# Patient Record
Sex: Female | Born: 1955 | Race: White | Hispanic: No | Marital: Married | Smoking: Never smoker
Health system: Southern US, Community
[De-identification: ages and names within clinical notes are randomized; demographics above are authoritative.]

## PROBLEM LIST (undated history)

## (undated) DIAGNOSIS — E785 Hyperlipidemia, unspecified: Secondary | ICD-10-CM

## (undated) DIAGNOSIS — Z9851 Tubal ligation status: Secondary | ICD-10-CM

## (undated) DIAGNOSIS — M1990 Unspecified osteoarthritis, unspecified site: Secondary | ICD-10-CM

## (undated) DIAGNOSIS — R011 Cardiac murmur, unspecified: Secondary | ICD-10-CM

## (undated) HISTORY — DX: Tubal ligation status: Z98.51

## (undated) HISTORY — DX: Cardiac murmur, unspecified: R01.1

## (undated) HISTORY — DX: Unspecified osteoarthritis, unspecified site: M19.90

## (undated) HISTORY — DX: Hyperlipidemia, unspecified: E78.5

## (undated) HISTORY — PX: KNEE SURGERY: SHX244

---
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
## 1980-02-26 HISTORY — PX: TUBAL LIGATION: SHX77

## 1998-11-28 ENCOUNTER — Other Ambulatory Visit: Admission: RE | Admit: 1998-11-28 | Discharge: 1998-11-28 | Payer: Self-pay | Admitting: Obstetrics and Gynecology

## 2002-01-19 ENCOUNTER — Other Ambulatory Visit: Admission: RE | Admit: 2002-01-19 | Discharge: 2002-01-19 | Payer: Self-pay | Admitting: Gynecology

## 2002-02-11 ENCOUNTER — Encounter: Admission: RE | Admit: 2002-02-11 | Discharge: 2002-05-12 | Payer: Self-pay | Admitting: Obstetrics and Gynecology

## 2005-03-07 ENCOUNTER — Other Ambulatory Visit: Admission: RE | Admit: 2005-03-07 | Discharge: 2005-03-07 | Payer: Self-pay | Admitting: Gynecology

## 2006-06-26 ENCOUNTER — Other Ambulatory Visit: Admission: RE | Admit: 2006-06-26 | Discharge: 2006-06-26 | Payer: Self-pay | Admitting: Gynecology

## 2007-08-27 ENCOUNTER — Other Ambulatory Visit: Admission: RE | Admit: 2007-08-27 | Discharge: 2007-08-27 | Payer: Self-pay | Admitting: Gynecology

## 2009-04-20 ENCOUNTER — Ambulatory Visit: Payer: Self-pay | Admitting: Women's Health

## 2009-04-20 ENCOUNTER — Other Ambulatory Visit: Admission: RE | Admit: 2009-04-20 | Discharge: 2009-04-20 | Payer: Self-pay | Admitting: Gynecology

## 2009-11-29 ENCOUNTER — Encounter: Admission: RE | Admit: 2009-11-29 | Discharge: 2009-11-29 | Payer: Self-pay | Admitting: Gynecology

## 2010-07-09 ENCOUNTER — Other Ambulatory Visit (HOSPITAL_COMMUNITY)
Admission: RE | Admit: 2010-07-09 | Discharge: 2010-07-09 | Disposition: A | Discharge status: Home / Self Care | Payer: Managed Care, Other (non HMO) | Source: Ambulatory Visit | Attending: Gynecology | Admitting: Gynecology

## 2010-07-09 ENCOUNTER — Encounter (INDEPENDENT_AMBULATORY_CARE_PROVIDER_SITE_OTHER): Payer: Managed Care, Other (non HMO) | Admitting: Women's Health

## 2010-07-09 ENCOUNTER — Other Ambulatory Visit: Payer: Self-pay | Admitting: Women's Health

## 2010-07-09 DIAGNOSIS — Z13220 Encounter for screening for lipoid disorders: Secondary | ICD-10-CM

## 2010-07-09 DIAGNOSIS — Z124 Encounter for screening for malignant neoplasm of cervix: Secondary | ICD-10-CM | POA: Insufficient documentation

## 2010-07-09 DIAGNOSIS — Z833 Family history of diabetes mellitus: Secondary | ICD-10-CM

## 2010-07-09 DIAGNOSIS — E079 Disorder of thyroid, unspecified: Secondary | ICD-10-CM

## 2010-07-09 DIAGNOSIS — Z01419 Encounter for gynecological examination (general) (routine) without abnormal findings: Secondary | ICD-10-CM

## 2010-07-19 ENCOUNTER — Encounter (INDEPENDENT_AMBULATORY_CARE_PROVIDER_SITE_OTHER): Payer: Managed Care, Other (non HMO)

## 2010-07-19 DIAGNOSIS — Z13820 Encounter for screening for osteoporosis: Secondary | ICD-10-CM

## 2010-10-19 ENCOUNTER — Ambulatory Visit (INDEPENDENT_AMBULATORY_CARE_PROVIDER_SITE_OTHER): Payer: Managed Care, Other (non HMO) | Admitting: Women's Health

## 2010-10-19 ENCOUNTER — Encounter: Payer: Self-pay | Admitting: Women's Health

## 2010-10-19 ENCOUNTER — Other Ambulatory Visit: Payer: Managed Care, Other (non HMO)

## 2010-10-19 VITALS — BP 130/70

## 2010-10-19 DIAGNOSIS — N950 Postmenopausal bleeding: Secondary | ICD-10-CM

## 2010-10-19 MED ORDER — MEGESTROL ACETATE 20 MG PO TABS: 20.0000 mg | ORAL_TABLET | Freq: Two times a day (BID) | ORAL | Status: AC

## 2010-10-19 NOTE — Progress Notes (Signed)
  Presents with c/o postmenopausal bleeding. LMP June 1 of 2011, had bleeding that started August 8  and has continued, heavy at times with gushing bright red bleeding. States she's used about 4 pads today. Ultrasound today does show a single fibroid or endometrial polyp 20 x 18 x 20mm, both right and ovaries do appear normal. Will schedule a sonohysterogram, bx with Dr. Lily Peer. Did review this will probably need to be removed hysteroscopically.  Handout was given.  Will treat with Megace 20 mg by mouth twice a day until bleeding stops, will schedule followup with Dr. Lily Peer, and will call if no relief.

## 2010-11-13 ENCOUNTER — Ambulatory Visit: Admission: RE | Admit: 2010-11-13 | Payer: Managed Care, Other (non HMO) | Source: Ambulatory Visit

## 2010-11-13 ENCOUNTER — Ambulatory Visit (INDEPENDENT_AMBULATORY_CARE_PROVIDER_SITE_OTHER): Payer: Managed Care, Other (non HMO) | Admitting: Gynecology

## 2010-11-13 DIAGNOSIS — D250 Submucous leiomyoma of uterus: Secondary | ICD-10-CM

## 2010-11-13 DIAGNOSIS — N949 Unspecified condition associated with female genital organs and menstrual cycle: Secondary | ICD-10-CM

## 2010-11-13 DIAGNOSIS — D259 Leiomyoma of uterus, unspecified: Secondary | ICD-10-CM

## 2010-11-13 DIAGNOSIS — N950 Postmenopausal bleeding: Secondary | ICD-10-CM

## 2010-11-13 NOTE — Progress Notes (Signed)
Patient is a 55 year old gravida 4 para 2 AB 2 who was seen in the office on August 24 by my nurse practitioner Maryelizabeth Rowan. Patient had been complaining of postmenopausal bleeding recently which started in August 8 and had continued. Patient reported to be heavy at times gushing of bright red blood. The bleeding require use 4 pads per day. She had an ultrasound which demonstrated single fibroid on an endometrial polyp or myoma and for this reason she was here in the office for a sonohysterogram and possible endometrial biopsy both ovaries appeared to be normal otherwise.  Sonohysterogram (saline infusion histogram) demonstrated a right posterior wall submucous myoma measuring 24 x 23 x 22 mm. Uterus measured 8.4 x 5.7 x 4.3 mm and her endometrial stripe was 4.0 mm ovaries otherwise normal.  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 Patient will be scheduled for resectoscopic myomectomy using the hysteroscopic morcellator. The risks benefits and pros and cons of the operation were discussed with the patient and literature information was provided. All questions are answered and we'll follow accordingly and schedule.

## 2010-11-14 ENCOUNTER — Other Ambulatory Visit: Payer: Self-pay | Admitting: Gynecology

## 2010-11-14 DIAGNOSIS — Z01818 Encounter for other preprocedural examination: Secondary | ICD-10-CM

## 2010-11-23 ENCOUNTER — Encounter: Payer: Self-pay | Admitting: Women's Health

## 2010-12-03 ENCOUNTER — Ambulatory Visit: Payer: Managed Care, Other (non HMO) | Admitting: Gynecology

## 2010-12-03 DIAGNOSIS — Z01818 Encounter for other preprocedural examination: Secondary | ICD-10-CM

## 2010-12-03 DIAGNOSIS — R82998 Other abnormal findings in urine: Secondary | ICD-10-CM

## 2010-12-05 ENCOUNTER — Other Ambulatory Visit: Payer: Self-pay | Admitting: Gynecology

## 2010-12-05 ENCOUNTER — Ambulatory Visit (HOSPITAL_BASED_OUTPATIENT_CLINIC_OR_DEPARTMENT_OTHER)
Admission: RE | Admit: 2010-12-05 | Discharge: 2010-12-05 | Disposition: A | Discharge status: Home / Self Care | Payer: Managed Care, Other (non HMO) | Source: Ambulatory Visit | Attending: Gynecology | Admitting: Gynecology

## 2010-12-05 DIAGNOSIS — Z79899 Other long term (current) drug therapy: Secondary | ICD-10-CM | POA: Insufficient documentation

## 2010-12-05 DIAGNOSIS — N950 Postmenopausal bleeding: Secondary | ICD-10-CM | POA: Insufficient documentation

## 2010-12-05 DIAGNOSIS — D250 Submucous leiomyoma of uterus: Secondary | ICD-10-CM

## 2010-12-05 DIAGNOSIS — Z800 Family history of malignant neoplasm of digestive organs: Secondary | ICD-10-CM | POA: Insufficient documentation

## 2010-12-05 DIAGNOSIS — Z8041 Family history of malignant neoplasm of ovary: Secondary | ICD-10-CM | POA: Insufficient documentation

## 2010-12-05 DIAGNOSIS — Z833 Family history of diabetes mellitus: Secondary | ICD-10-CM | POA: Insufficient documentation

## 2010-12-05 HISTORY — PX: HYSTEROSCOPY: SHX211

## 2010-12-10 ENCOUNTER — Encounter: Payer: Self-pay | Admitting: Gynecology

## 2010-12-12 ENCOUNTER — Telehealth: Payer: Self-pay | Admitting: *Deleted

## 2010-12-12 NOTE — Telephone Encounter (Signed)
Not unusual to bleed a week out. If continues I would recommend office visit Friday before weekend to check. If it's down to spotting or goes away then I would watch and follow up at her postop appointment

## 2010-12-12 NOTE — Telephone Encounter (Signed)
Pt informed and will watch it. And call Fri if needed. Kw

## 2010-12-12 NOTE — Telephone Encounter (Signed)
Pt is one week out from Resectoscopic Myomectomy with myosure with Dr Lily Peer. She began spotting pink 2 days but today is having a red flow. Is this normal to bleed one week after this procedure? If not pt has already been advised may have to have OV. She wanted me to check first if some bleeding could be normal. Pls Advise

## 2010-12-19 ENCOUNTER — Ambulatory Visit (INDEPENDENT_AMBULATORY_CARE_PROVIDER_SITE_OTHER): Payer: Managed Care, Other (non HMO) | Admitting: Gynecology

## 2010-12-19 ENCOUNTER — Encounter: Payer: Self-pay | Admitting: Gynecology

## 2010-12-19 VITALS — BP 120/88

## 2010-12-19 DIAGNOSIS — L292 Pruritus vulvae: Secondary | ICD-10-CM

## 2010-12-19 DIAGNOSIS — N390 Urinary tract infection, site not specified: Secondary | ICD-10-CM

## 2010-12-19 DIAGNOSIS — L293 Anogenital pruritus, unspecified: Secondary | ICD-10-CM

## 2010-12-19 DIAGNOSIS — R300 Dysuria: Secondary | ICD-10-CM

## 2010-12-19 DIAGNOSIS — D259 Leiomyoma of uterus, unspecified: Secondary | ICD-10-CM

## 2010-12-19 MED ORDER — FLUCONAZOLE 150 MG PO TABS: 150.0000 mg | ORAL_TABLET | Freq: Once | ORAL | Status: AC

## 2010-12-19 MED ORDER — CLINDAMYCIN PHOSPHATE 2 % VA CREA: 1.0000 | TOPICAL_CREAM | Freq: Every day | VAGINAL | Status: AC

## 2010-12-19 MED ORDER — NITROFURANTOIN MONOHYD MACRO 100 MG PO CAPS: 100.0000 mg | ORAL_CAPSULE | Freq: Two times a day (BID) | ORAL | Status: AC

## 2010-12-19 NOTE — H&P (Signed)
NAME:  Julia Warren, Julia Warren             ACCOUNT NO.:  [id number]  MEDICAL RECORD NO.:  [id number]  LOCATION:                                 FACILITY:  PHYSICIAN:  Elnathan Fulford H. Lily Peer, M.D.DATE OF BIRTH:  09/11/1955  DATE OF ADMISSION:  12/05/2010 DATE OF DISCHARGE:                             HISTORY & PHYSICAL   The patient is scheduled for surgery tomorrow, October 10th, at 1 p.m. at [facility].  Please have history and physical available.  CHIEF COMPLAINT: 1. Postmenopausal bleeding. 2. Submucous myoma.  HISTORY:  The patient is a 55 year old, gravida 4, para 2, AB 2, who had been complaining of postmenopausal bleeding, which started on August 8th and has continued for several days.  She described as heavy at times and gushing of bright red blood.  She had bled to the point that had required 4 pads per day.  She had an ultrasound, which demonstrated a single submucous myoma, which measured 24 x 23 x 22 mm.  The ovaries otherwise were normal.  Her endometrial stripe was 4.0 mm.  PAST MEDICAL HISTORY:  She is allergic to: 1. PENICILLIN. 2. CODEINE.  PAST OBSTETRICAL HISTORY: 1. She has had bilateral tubal ligation in the past. 2. History of HSV in 1994.  MEDICATIONS:  Consist of: 1. Valtrex p.r.n. 2. She takes fish oil daily. 3. Flaxseed daily. 4. Vitamin B and C daily.  SURGERIES:  Included: 1. Appendectomy. 2. Bilateral tubal sterilization procedures. 3. Her children were delivered vaginally.  FAMILY HISTORY:  Significant for diabetes in her mother, colon cancer in maternal grandmother, and ovarian or uterine cancer in maternal aunt.  REVIEW OF SYSTEMS:  Unremarkable.  PHYSICAL EXAMINATION:  VITAL SIGNS:  The patient weighs 171 pounds, she is 5 feet 4-1/4 inch tall, BMI 29, blood pressure 110/70. HEENT:  Unremarkable. NECK:  Supple.  Trachea midline.  No carotid bruits.  No thyromegaly. LUNGS:  Clear to auscultation.  No rhonchi or  wheezes. HEART:  Regular rate and rhythm.  No murmurs or gallops. BREAST EXAM:  Not done. ABDOMEN:  Soft, nontender.  No rebound or guarding. PELVIC:  Bartholin, urethra, and Skene glands within normal limits. Vagina and cervix, no gross lesions on inspection.  Uterus is in upper limits of normal.  No palpable adnexal masses, although limited due to pendulous abdomen. RECTAL EXAM:  Deferred.  ASSESSMENT:  The patient is a 55 year old with postmenopausal bleeding. Sonohysterogram demonstrated submucous myoma measuring 22 x 10 x 20 mm. The patient is scheduled to undergo resectoscopic myomectomy at [facility] on Wednesday, October 10th at 1 p.m.  The risks, benefits, and pros and cons of the operation were discussed to include infection, although she will receive prophylaxis antibiotic.  The risk for deep venous thrombosis and subsequent pulmonary embolism, she will have PSI stockings.  In the event of uncontrollable hemorrhage or uterine perforation or fluid extravasation or is an emergency, a laparotomy may need to be performed and the patient may need to have a total hysterectomy with or without removal of both tubes and ovary.  In the event that she would need blood or blood products, she is fully aware of the potential risk  of anaphylactic reaction, hepatitis, and AIDS.  All these issues were discussed with the patient.  All questions were answered.  We will follow accordingly.  PLAN:  The patient is scheduled for resectoscopic myomectomy on Wednesday, October 10th at 13 a.m. at [facility]. Please have history and physical available.     Zulay Corrie H. Lily Peer, M.D.     JHF/MEDQ  D:  12/04/2010  T:  12/05/2010  Job:  [id number]  Electronically Signed by Reynaldo Minium M.D. on 12/19/2010 02:29:20 PM

## 2010-12-19 NOTE — Op Note (Signed)
NAME:  JOYCIE, AERTS             ACCOUNT NO.:  [id number]  MEDICAL RECORD NO.:  [id number]  LOCATION:                               FACILITY:  [facility]  PHYSICIAN:  Juan H. Lily Peer, M.D.DATE OF BIRTH:  10/06/1955  DATE OF PROCEDURE:  12/05/2010 DATE OF DISCHARGE:                              OPERATIVE REPORT   INDICATION FOR OPERATION:  A 55 year old gravida 4, para 2, OB 2 with postmenopausal bleeding.  Preoperative sonohysterogram demonstrated a submucous myoma, measuring 24 x 23 x 22 mm with an endometrial stripe of 4.0 mm.  PREOPERATIVE DIAGNOSES: 1. Postmenopausal bleeding. 2. Submucous myoma.  POSTOPERATIVE DIAGNOSIS: 1. Postmenopausal bleeding. 2. Submucous myoma.  ANESTHESIA:  General endotracheal anesthesia.  PROCEDURE PERFORMED:  Resectoscopic myomectomy.  FINDINGS:  A 2.5 cm submucous myoma, broad based, posteriorly located in the uterus.  Both tubal ostia well identified, thin endometrium otherwise.  Myoma highly vascular.  DESCRIPTION OF OPERATION:  After the patient was adequately counseled, she was taken to the operative room where she underwent a successful general endotracheal anesthesia.  Due to her allergy with penicillin, she had received Cleocin and gentamicin (prophylaxis).  She also had PSA stockings for DVT prophylaxis as well.  After general endotracheal anesthesia was obtained, she was placed in high lithotomy position.  The vagina and perineum were prepped and draped in the sterile fashion.  The patient previously voided before coming to the operating room, so an in- and-out catheterization was not done.  Bimanual examination demonstrated an anteverted uterus with no palpable adnexal masses.  A weighted speculum was placed in the posterior vaginal vault and  a Sims retractor in the anterior vaginal vault.  A single-toothed tenaculum was grasped on the anterior cervical lip.  For hemostatic control, the patient was given Pitressin as a  paracervical block consisting of 20 units per 20 cc of normal saline for a total of 10 cc.  The cervix hence was dilated with serial dilators to a size of 23 mm.  Once this was accomplished, the MyoSure operative resectoscope was introduced into the intrauterine cavity.  Normal saline was the distending media, pictures were obtained. The myoma encroached most of the endometrial cavity.  The MyoSure morcellator was utilized to systematically shaved from 3 o'clock to 9 o'clock to try to morcellate the myoma, but due to the consistency in being hard and calcified, we cannot finish the operation with the resectoscopic morcellator.  The operating element was then switched to a 90 degree wire loop, attached to the Valleylab electrical surgical generator.  The distending media then was switched from normal saline to 1.5% glycine.  The lines were flushed before commencing with the operation.  The operative resectoscope was introduced into the intrauterine cavity and the Valleylab electrical surgical generator set at 80 watts on the cutting mode and 70 watts on the coagulation mode. The myoma was resected to pretty close to its base, but due to the fact that on sonohysterogram demonstrated this myoma extended into the myometrium,  It was decided not to pursue any further and running the risk of uterine perforation, as had been previously discussed with the husband and the patient preoperatively.  The operating element was then  switched to the VaporTrode whereby the Valleylab electrosurgical generator was set at 160 watts on the cutting mode and 70 watts on the coagulation mode to ablate some more of the myoma and contain some of the vascularity and hopefully that the myoma start to necrosis on its own postoperatively.  Pre and post resectoscopic myomectomy procedures were obtained.  The patient tolerated the procedure well.  The single- toothed tenaculum was removed.  The patient was awakened,  transferred to recovery room with stable vital signs.  Blood loss was minimal.  Fluid resuscitation consisted of 1200 cc of lactated Ringer.  Fluid deficit was 250 cc.  The patient tolerated the procedure well and was transferred to recovery room with stable vital signs.     Juan H. Lily Peer, M.D.     JHF/MEDQ  D:  12/05/2010  T:  12/06/2010  Job:  [id number]  Electronically Signed by Reynaldo Minium M.D. on 12/19/2010 02:29:37 PM

## 2010-12-19 NOTE — Progress Notes (Signed)
Patient is a 55 year old gravida 4 para 2 Ab2 with postmenopausal bleeding had undergone a preoperative evaluation consisting of sonohysterogram which demonstrated a submucous myoma which measured 24 x 23 x 2 mm with a uterine endometrial stripe of 4.0 mm. She was taken to the operating room October 10 whereby she underwent a resectoscopic myomectomy using the Myosure morcellator as well as the operative resectoscope. The myoma was partially embedded in the myometrium so a complete enucleation was not possible without perforating the uterus. The base of the myoma had been ablated as well. Patient presented to the office today for a postop visit 2 weeks later she is having no bleeding now she did for a few days afterwards. Her pathology report demonstrated a benign fibroids.  Pelvic exam: Bartholin urethra Skene glands within normal limits Vagina: No lesions or discharge Cervix: No lesions or discharge Uterus: Anteverted normal size shape and consistency Adnexa: No palpable masses or tenderness Rectal exam: Not done  Assessment 2 weeks postop status post resectoscopic myomectomy. Complete enucleation was not possible due to the fact the myoma was partially transmural. Pictures were shown to the patient will monitor over the course of the next 6 months when she returned back for sonohysterogram. If she recurs with the bleeding we discussed then proceeding with a total laparoscopic hysterectomy with bilateral salpingo-oophorectomy. Patient was having some urinary frequency urinalysis demonstrated 20+ WBCs 3+ bacteria she will be placed on Macrobid one tablet by mouth twice a day for 7 day course.

## 2011-01-11 ENCOUNTER — Telehealth: Payer: Self-pay | Admitting: *Deleted

## 2011-01-11 MED ORDER — MEGESTROL ACETATE 40 MG PO TABS: ORAL_TABLET | ORAL | Status: DC

## 2011-01-11 NOTE — Telephone Encounter (Signed)
PT INFORMED WITH THE BELOW NOTE, RX SENT TO PHARMACY. 

## 2011-01-11 NOTE — Telephone Encounter (Signed)
Patient with recent resectoscopic myomectomy but due to the size of the myoma being partially transmural the entire myomas not have to be resected. This had been discussed with the patient that she continued to have bleeding we may need to proceed with definitive surgery such as a hysterectomy. She's call today she was having some bleeding. Will place her on Megace 40 mg one tablet 3 times a day for the next 3 days followed by one tablet twice a day for the next 10 days. She will need to make an appointment to see me next week so we can plan on definitive management.

## 2011-01-11 NOTE — Telephone Encounter (Signed)
Pt had resectoscopic myomectomy on October 10 and is calling c/o bleeding times 2 days now. She states that the bleeding is getting heavier, started yesterday. Pt was told to call office if any bleeding should start. lmp 08/14/2009 please advise.

## 2011-01-23 ENCOUNTER — Ambulatory Visit (INDEPENDENT_AMBULATORY_CARE_PROVIDER_SITE_OTHER): Payer: Managed Care, Other (non HMO) | Admitting: Gynecology

## 2011-01-23 ENCOUNTER — Encounter: Payer: Self-pay | Admitting: Gynecology

## 2011-01-23 VITALS — BP 128/80

## 2011-01-23 DIAGNOSIS — Z09 Encounter for follow-up examination after completed treatment for conditions other than malignant neoplasm: Secondary | ICD-10-CM

## 2011-01-23 NOTE — Progress Notes (Signed)
Patient 55 year old 4 para 2 Ab2 who presented to the office for for final postop visit she status post resectoscopic myomectomy. Due to the large size of the fibroid being intramural partial resection was completed and the whole myoma was not enucleated. She was seen in the office for her first postop visit on October 24 had some pinkish discharge and she was placed on Megace 40 mg twice a day which is currently taking to stop her bleeding. On pelvic exam there was no evidence of any vaginal bleeding her uterus was nontender no palpable adnexal masses. She was reassured she will continue with the Megace for 2 more weeks and perhaps into the holiday and when she discontinues it if she continues to bleed we discussed that the definitive surgery would have to be with hysterectomy since entire myoma was not able to be enucleated. She fully understands and accepts and we'll follow accordingly. 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

## 2011-02-01 ENCOUNTER — Ambulatory Visit (INDEPENDENT_AMBULATORY_CARE_PROVIDER_SITE_OTHER): Payer: Managed Care, Other (non HMO) | Admitting: Gynecology

## 2011-02-01 ENCOUNTER — Telehealth: Payer: Self-pay | Admitting: *Deleted

## 2011-02-01 ENCOUNTER — Encounter: Payer: Self-pay | Admitting: Gynecology

## 2011-02-01 VITALS — BP 120/82

## 2011-02-01 DIAGNOSIS — R300 Dysuria: Secondary | ICD-10-CM

## 2011-02-01 DIAGNOSIS — D250 Submucous leiomyoma of uterus: Secondary | ICD-10-CM

## 2011-02-01 DIAGNOSIS — N938 Other specified abnormal uterine and vaginal bleeding: Secondary | ICD-10-CM

## 2011-02-01 DIAGNOSIS — N390 Urinary tract infection, site not specified: Secondary | ICD-10-CM

## 2011-02-01 DIAGNOSIS — N949 Unspecified condition associated with female genital organs and menstrual cycle: Secondary | ICD-10-CM

## 2011-02-01 DIAGNOSIS — R823 Hemoglobinuria: Secondary | ICD-10-CM

## 2011-02-01 MED ORDER — LEUPROLIDE ACETATE (3 MONTH) 11.25 MG IM KIT
11.2500 mg | PACK | Freq: Once | INTRAMUSCULAR | Status: AC
  Administered 2011-02-01: 11.25 mg via INTRAMUSCULAR

## 2011-02-01 NOTE — Telephone Encounter (Signed)
Pt calling to follow up from previous office visit, pt had resectoscopic myomectomy on 12/05/10. She has been bleeding since Nov 14. She was giving megace 40 mg twice a day to take and bleeding is still there. Pt was told to call if the bleeding becomes bright red and it has been for the past 4 days, along with lower pelvic aching. Pt states she is not in pain, but the aching continues. Please advise.

## 2011-02-01 NOTE — Patient Instructions (Signed)
Continue to take megace until Sunday only. Take iron tablet one daily. [facility] will call you next week for schedule of surgery and preop visit.

## 2011-02-01 NOTE — Progress Notes (Signed)
Patient presented to the office today as a result of persistent vaginal bleeding. See previous note November 28. Patient had undergone a resectoscopic myomectomy recently. Due to the large size of the fibroid and being intramural it was partially enucleated and it was cauterized as well. She continued to bleed we had placed on Megace 40 mg twice a day. And presented to the office today is instructed to make a final determination at was to proceed with hysterectomy in the next few weeks. We had a discussion of the Lupron which as a GnRH analog to help shrink the fibroid and cut down her bleeding as we plan on scheduling her surgery for January. She's had 2 normal spontaneous vaginal deliveries and has had a previous appendectomy and a laparoscopic tubal ligation. She will be a good candidate for transvaginal hysterectomy with bilateral salpingo-oophorectomy. Literature formation was provided and she will return back next month the week prior to her surgery for preoperative examination. She will stop by the lab for CBC today. And she was instructed to continue to take one iron tablet daily. And she will stop the Megace this weekend. She has some slight irritation during voiding her urinalysis was nonspecific to 3 WBC 1+ bacteria we'll run culture on it today as well and notify her if there is any abnormality.

## 2011-02-01 NOTE — Telephone Encounter (Signed)
Per JF come today for Lupron shot. She will be here at 415pm. Apts notified.

## 2011-02-04 MED ORDER — NITROFURANTOIN MONOHYD MACRO 100 MG PO CAPS: 100.0000 mg | ORAL_CAPSULE | Freq: Two times a day (BID) | ORAL | Status: AC

## 2011-02-04 NOTE — Progress Notes (Signed)
Addended by: Aura Camps on: 02/04/2011 11:53 AM   Modules accepted: Orders

## 2011-02-06 ENCOUNTER — Telehealth: Payer: Self-pay

## 2011-02-06 NOTE — Telephone Encounter (Signed)
Left a message for patient to call me regarding scheduling her surgery.

## 2011-06-03 ENCOUNTER — Other Ambulatory Visit: Payer: Self-pay | Admitting: Gynecology

## 2011-06-03 ENCOUNTER — Ambulatory Visit (INDEPENDENT_AMBULATORY_CARE_PROVIDER_SITE_OTHER): Payer: Managed Care, Other (non HMO) | Admitting: Gynecology

## 2011-06-03 ENCOUNTER — Ambulatory Visit (INDEPENDENT_AMBULATORY_CARE_PROVIDER_SITE_OTHER): Payer: Managed Care, Other (non HMO)

## 2011-06-03 DIAGNOSIS — N920 Excessive and frequent menstruation with regular cycle: Secondary | ICD-10-CM

## 2011-06-03 DIAGNOSIS — N938 Other specified abnormal uterine and vaginal bleeding: Secondary | ICD-10-CM

## 2011-06-03 DIAGNOSIS — N950 Postmenopausal bleeding: Secondary | ICD-10-CM

## 2011-06-03 DIAGNOSIS — D259 Leiomyoma of uterus, unspecified: Secondary | ICD-10-CM

## 2011-06-03 DIAGNOSIS — D250 Submucous leiomyoma of uterus: Secondary | ICD-10-CM

## 2011-06-03 DIAGNOSIS — N83339 Acquired atrophy of ovary and fallopian tube, unspecified side: Secondary | ICD-10-CM

## 2011-06-03 DIAGNOSIS — N939 Abnormal uterine and vaginal bleeding, unspecified: Secondary | ICD-10-CM

## 2011-06-03 NOTE — Progress Notes (Signed)
Patient is a 56 year old who 12/05/2010 had undergone an attempted resectoscopic myomectomy with hysteroscopic morcellator. The myoma was partially embedded in the myometrium so a complete enucleation was not possible at that time without the risk of uterine perforation. The base of it was cauterized and patient was to be followed up for possible 2-stage procedure or hysterectomy if continued bleeding. She continued to have some bleeding shortly thereafter and had been placed on Megace 40 mg twice a day for a two-week course. She was c on December 7 and was placed on Lupron 11.250 g IM and was to return to the office in 3 months for followup sonohysterogram. She has stated that she has not had any bleeding since December and is doing well. Today's sonohysterogram as follows:  Uterus measures 7.9 x 5.7 x 4.0 cm with an endometrial stripe of 2.6 mm. Endometrial stripe 2.6 mm. No intramural myoma was seen. Right ovary with calcifications. Left ovary atrophic. Sonohysterogram no intracavitary defects.  The findings discussed with the patient no further treatment is needed at this time. It appears the partial submucous resectoscopic myomectomy along with cauterization of the base and in combination with Lupron has completely shrunk off the myoma. We'll continue to monitor clinically if she has any problem she will return back to the office otherwise she'll return back at the end of the year for her annual exam or when necessary.

## 2011-08-09 ENCOUNTER — Other Ambulatory Visit: Payer: Self-pay | Admitting: Women's Health

## 2012-08-21 ENCOUNTER — Encounter: Payer: Self-pay | Admitting: Internal Medicine

## 2012-10-05 ENCOUNTER — Other Ambulatory Visit: Payer: Self-pay

## 2012-10-05 ENCOUNTER — Ambulatory Visit: Payer: Self-pay | Admitting: Gynecology

## 2012-10-05 DIAGNOSIS — Z1231 Encounter for screening mammogram for malignant neoplasm of breast: Secondary | ICD-10-CM

## 2012-10-06 ENCOUNTER — Ambulatory Visit (INDEPENDENT_AMBULATORY_CARE_PROVIDER_SITE_OTHER): Payer: Medicare HMO | Admitting: Physician Assistant

## 2012-10-06 VITALS — BP 118/62 | HR 55 | Temp 97.9°F | Resp 18 | Ht 65.0 in | Wt 171.0 lb

## 2012-10-06 DIAGNOSIS — R350 Frequency of micturition: Secondary | ICD-10-CM

## 2012-10-06 DIAGNOSIS — R300 Dysuria: Secondary | ICD-10-CM

## 2012-10-06 LAB — POCT URINALYSIS DIPSTICK
Bilirubin, UA: NEGATIVE
Glucose, UA: NEGATIVE
Ketones, UA: NEGATIVE
Spec Grav, UA: >=1.03
Urobilinogen, UA: 0.2

## 2012-10-06 LAB — POCT UA - MICROSCOPIC ONLY
Crystals, Ur, HPF, POC: NEGATIVE
Mucus, UA: NEGATIVE

## 2012-10-06 MED ORDER — FLUCONAZOLE 150 MG PO TABS: 150.0000 mg | ORAL_TABLET | Freq: Once | ORAL | Status: DC

## 2012-10-06 MED ORDER — PHENAZOPYRIDINE HCL 200 MG PO TABS: 200.0000 mg | ORAL_TABLET | Freq: Three times a day (TID) | ORAL | Status: DC | PRN

## 2012-10-06 MED ORDER — CIPROFLOXACIN HCL 500 MG PO TABS: 500.0000 mg | ORAL_TABLET | Freq: Two times a day (BID) | ORAL | Status: DC

## 2012-10-06 NOTE — Progress Notes (Signed)
Patient ID: Julia [facility] MRN: [id number], DOB: [birth year], 57 y.o. Date of Encounter: 10/06/2012, 3:30 PM  Primary Physician: Tonye Pearson, MD  Chief Complaint: urinary frequency and dysuria  HPI: 57 y.o. year old female with presents with 7 day history of urinary frequency, dysuria, and suprapubic pressure. Denies flank pain, nausea, vomiting, fever, chills, vaginal discharge, or odor. Has tried increasing fluids. Patient is postmenopausal.   Patient is otherwise doing well without issues or complaints.  Past Medical History  Diagnosis Date  . Tubal ligation status   . HSV-2 infection 1994     Home Meds: Prior to Admission medications   Medication Sig Start Date End Date Taking? Authorizing Provider  Ascorbic Acid (VITAMIN C PO) Take by mouth daily.     Yes Historical Provider, MD  Calcium Carbonate-Vitamin D (CALCIUM + D PO) Take by mouth.     Yes Historical Provider, MD  Cyanocobalamin (VITAMIN B 12 PO) Take by mouth daily.     Yes Historical Provider, MD  Omega-3 Fatty Acids (FISH OIL) 1200 MG CAPS Take by mouth.     Yes Historical Provider, MD  valACYclovir (VALTREX) 500 MG tablet TAKE 1 TABLET BY MOUTH TWICE A DAY AS NEEDED 08/09/11  Yes Harrington Challenger, NP  VITAMIN E PO Take by mouth daily.     Yes Historical Provider, MD  ciprofloxacin (CIPRO) 500 MG tablet Take 1 tablet (500 mg total) by mouth 2 (two) times daily. 10/06/12   Allia Wiltsey M Avon Mergenthaler, PA-C  Flaxseed, Linseed, (FLAX SEED OIL PO) Take by mouth daily.      Historical Provider, MD  fluconazole (DIFLUCAN) 150 MG tablet Take 1 tablet (150 mg total) by mouth once. Repeat if needed 10/06/12   Nelva Nay, PA-C  megestrol (MEGACE) 40 MG tablet TAKE ONE TABLET 3 TIMES A DAY FOR 3 DAYS THEN 1 TABLET TWICE A DAY FOR THE NEXT 10 DAYS 01/11/11   Ok Edwards, MD  phenazopyridine (PYRIDIUM) 200 MG tablet Take 1 tablet (200 mg total) by mouth 3 (three) times daily as needed for pain. 10/06/12   Nelva Nay, PA-C     Allergies:  Allergies  Allergen Reactions  . Codeine   . Penicillins     History   Social History  . Marital Status: Married    Spouse Name: N/A    Number of Children: N/A  . Years of Education: N/A   Occupational History  . Not on file.   Social History Main Topics  . Smoking status: Never Smoker   . Smokeless tobacco: Never Used  . Alcohol Use: Yes     Comment: occ  . Drug Use: No  . 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 No narrative on file     Review of Systems: Constitutional: negative for chills, fever, night sweats, weight changes, or fatigue  HEENT: negative for vision changes, hearing loss, congestion, rhinorrhea, ST, epistaxis, or sinus pressure Cardiovascular: negative for chest pain or palpitations Respiratory: negative for cough, hemoptysis, wheezing, shortness of breath. Abdominal: positive for suprapubic abdominal pain,   No nausea, vomiting, diarrhea, or constipation Genitourinary: positive for urinary frequency and dysuria. Negative for vaginal discharge, odor, or pelvic pain.  Dermatological: negative for rashes. Neurologic: negative for headache, dizziness, or syncope   Physical Exam: Blood pressure 118/62, pulse 55, temperature 97.9 F (36.6 C),  temperature source Oral, resp. rate 18, height 5\' 5"  (1.651 m), weight 171 lb (77.565 kg), last menstrual period 08/14/2009, SpO2 96.00%., Body mass index is 28.46 kg/(m^2). General: Well developed, well nourished, in no acute distress. Head: Normocephalic, atraumatic, eyes without discharge, sclera non-icteric, nares are without discharge. External ear normal in appearance. Neck: Supple. No thyromegaly. Full ROM. No lymphadenopathy. Lungs: Clear bilaterally to auscultation without wheezes, rales, or rhonchi. Breathing is unlabored. Heart: RRR with S1 S2. No murmurs, rubs,  or gallops appreciated. Abdominal: +BS x 4. No hepatosplenomegaly, rebound tenderness, or guarding. Positive suprapubic tenderness. No CVA tenderness bilaterally.  Msk:  Strength and tone normal for age. Extremities/Skin: Warm and dry. No clubbing or cyanosis. No edema.  Neuro: Alert and oriented X 3. Moves all extremities spontaneously. Gait is normal. CNII-XII grossly in tact. Psych:  Responds to questions appropriately with a normal affect.   Labs:  Results for orders placed in visit on 10/06/12  POCT URINALYSIS DIPSTICK      Result Value Range   Color, UA yellow     Clarity, UA cloudy     Glucose, UA neg     Bilirubin, UA neg     Ketones, UA neg     Spec Grav, UA >=1.030     Blood, UA mod     pH, UA 5.0     Protein, UA 30     Urobilinogen, UA 0.2     Nitrite, UA neg     Leukocytes, UA moderate (2+)    POCT UA - MICROSCOPIC ONLY      Result Value Range   WBC, Ur, HPF, POC 8-20     RBC, urine, microscopic 8-20     Bacteria, U Microscopic 8-20     Mucus, UA neg     Epithelial cells, urine per micros 0-3     Crystals, Ur, HPF, POC neg     Casts, Ur, LPF, POC neg     Yeast, UA neg      ASSESSMENT AND PLAN:  57 y.o. year old female with urinary tract infection Urine culture sent Increase fluids Cipro 500 mg bid x 5 days Pyridium tid as needed for symptomatic relief Follow up if symptoms worsen or fail to improve.  Grier Mitts, PA-C 10/06/2012 3:30 PM

## 2012-10-08 LAB — URINE CULTURE: Colony Count: 30000

## 2012-10-20 ENCOUNTER — Ambulatory Visit: Payer: Managed Care, Other (non HMO)

## 2012-11-05 ENCOUNTER — Ambulatory Visit
Admission: RE | Admit: 2012-11-05 | Discharge: 2012-11-05 | Disposition: A | Discharge status: Home / Self Care | Payer: Managed Care, Other (non HMO) | Source: Ambulatory Visit

## 2012-11-05 DIAGNOSIS — Z1231 Encounter for screening mammogram for malignant neoplasm of breast: Secondary | ICD-10-CM

## 2012-11-06 ENCOUNTER — Ambulatory Visit (INDEPENDENT_AMBULATORY_CARE_PROVIDER_SITE_OTHER): Payer: Medicare HMO | Admitting: Women's Health

## 2012-11-06 ENCOUNTER — Other Ambulatory Visit (HOSPITAL_COMMUNITY)
Admission: RE | Admit: 2012-11-06 | Discharge: 2012-11-06 | Disposition: A | Discharge status: Home / Self Care | Payer: Managed Care, Other (non HMO) | Source: Ambulatory Visit | Attending: Gynecology | Admitting: Gynecology

## 2012-11-06 ENCOUNTER — Encounter: Payer: Self-pay | Admitting: Women's Health

## 2012-11-06 VITALS — BP 114/70 | Ht 64.25 in | Wt 172.0 lb

## 2012-11-06 DIAGNOSIS — E079 Disorder of thyroid, unspecified: Secondary | ICD-10-CM

## 2012-11-06 DIAGNOSIS — Z01419 Encounter for gynecological examination (general) (routine) without abnormal findings: Secondary | ICD-10-CM | POA: Insufficient documentation

## 2012-11-06 DIAGNOSIS — L293 Anogenital pruritus, unspecified: Secondary | ICD-10-CM

## 2012-11-06 DIAGNOSIS — N898 Other specified noninflammatory disorders of vagina: Secondary | ICD-10-CM

## 2012-11-06 DIAGNOSIS — Z13220 Encounter for screening for lipoid disorders: Secondary | ICD-10-CM

## 2012-11-06 DIAGNOSIS — D250 Submucous leiomyoma of uterus: Secondary | ICD-10-CM

## 2012-11-06 DIAGNOSIS — Z833 Family history of diabetes mellitus: Secondary | ICD-10-CM

## 2012-11-06 DIAGNOSIS — B009 Herpesviral infection, unspecified: Secondary | ICD-10-CM

## 2012-11-06 LAB — CBC WITH DIFFERENTIAL/PLATELET
HCT: 40.5 % (ref 36.0–46.0)
Lymphs Abs: 1.8 10*3/uL (ref 0.7–4.0)
MCH: 31.3 pg (ref 26.0–34.0)
MCHC: 35.1 g/dL (ref 30.0–36.0)
Monocytes Relative: 7 % (ref 3–12)
Neutro Abs: 2.3 10*3/uL (ref 1.7–7.7)
RDW: 13.3 % (ref 11.5–15.5)

## 2012-11-06 LAB — LIPID PANEL
Cholesterol: 251 mg/dL — ABNORMAL HIGH (ref 0–200)
HDL: 69 mg/dL (ref >39)
Total CHOL/HDL Ratio: 3.6 Ratio
Triglycerides: 134 mg/dL (ref <150)

## 2012-11-06 LAB — TSH: TSH: 2.191 u[IU]/mL (ref 0.350–4.500)

## 2012-11-06 LAB — GLUCOSE, RANDOM: Glucose, Bld: 94 mg/dL (ref 70–99)

## 2012-11-06 MED ORDER — CLOBETASOL PROPIONATE 0.05 % EX CREA: TOPICAL_CREAM | Freq: Two times a day (BID) | CUTANEOUS | Status: DC

## 2012-11-06 MED ORDER — VALACYCLOVIR HCL 500 MG PO TABS: ORAL_TABLET | ORAL | Status: DC

## 2012-11-06 NOTE — Progress Notes (Signed)
Julia Warren [birth year] [id number]    History:    The patient presents for annual exam.  Postmenopausal/no HRT with no bleeding. 11/2010 hysteroscope myomectomy for post menopausal bleeding, no bleeding after. Normal Pap and mammogram history. HSV-2 history rare outbreaks. Negative colonoscopy 2014, 2008. Maternal grandmother colon cancer. Normal DEXA 2012 bilateral hip average 0.1   Past medical history, past surgical history, family history and social history were all reviewed and documented in the EPIC chart. Travels with job. 2 daughters and 1 adopted son. 5 granddaughters, one with autism. Mother, brother diabetes, mother died age 38 from diabetes.   ROS:  A  ROS was performed and pertinent positives and negatives are included in the history.  Exam:  Filed Vitals:   11/06/12 1103  BP: 114/70    General appearance:  Normal Head/Neck:  Normal, without cervical or supraclavicular adenopathy. Thyroid:  Symmetrical, normal in size, without palpable masses or nodularity. Respiratory  Effort:  Normal  Auscultation:  Clear without wheezing or rhonchi Cardiovascular  Auscultation:  Regular rate, without rubs, murmurs or gallops  Edema/varicosities:  Not grossly evident Abdominal  Soft,nontender, without masses, guarding or rebound.  Liver/spleen:  No organomegaly noted  Hernia:  None appreciated  Skin  Inspection:  Grossly normal  Palpation:  Grossly normal Neurologic/psychiatric  Orientation:  Normal with appropriate conversation.  Mood/affect:  Normal  Genitourinary    Breasts: Examined lying and sitting.     Right: Without masses, retractions, discharge or axillary adenopathy.     Left: Without masses, retractions, discharge or axillary adenopathy.   Inguinal/mons:  Normal without inguinal adenopathy  External genitalia:  Normal  BUS/Urethra/Skene's glands:  Normal  Bladder:  Normal  Vagina:  Normal  Cervix:  Normal  Uterus:  normal in size, shape and contour.   Midline and mobile  Adnexa/parametria:     Rt: Without masses or tenderness.   Lt: Without masses or tenderness.  Anus and perineum: Normal  Digital rectal exam: Normal sphincter tone without palpated masses or tenderness  Assessment/Plan:  57 y.o. MWF G4P2 plus 1 adopted son for annual exam.    Erythematous patch left inner benign causing itching Postmenopausal/no bleeding/no HRT Normal DEXA HSV-2 lower back  Plan: SBE's, continue annual mammogram, calcium rich diet, vitamin D 2000 daily encouraged. Continue regular exercise, working with a Psychologist, educational. CBC, glucose, lipid panel, TSH, UA, Pap. Pap normal 2012, new screening guidelines reviewed. Valtrex 500 twice daily for 3-5 days as needed, prescription, proper use given and reviewed. Temovate 2 erythematous area twice daily, small amount call if no relief of symptoms.    Harrington Challenger [facility], 11:48 AM 11/06/2012

## 2012-11-06 NOTE — Patient Instructions (Addendum)
Health Recommendations for Postmenopausal Women Respected and ongoing research has looked at the most common causes of death, disability, and poor quality of life in postmenopausal women. The causes include heart disease, diseases of blood vessels, diabetes, depression, cancer, and bone loss (osteoporosis). Many things can be done to help lower the chances of developing these and other common problems: CARDIOVASCULAR DISEASE Heart Disease: A heart attack is a medical emergency. Know the signs and symptoms of a heart attack. Below are things women can do to reduce their risk for heart disease.   Do not smoke. If you smoke, quit.  Aim for a healthy weight. Being overweight causes many preventable deaths. Eat a healthy and balanced diet and drink an adequate amount of liquids.  Get moving. Make a commitment to be more physically active. Aim for 30 minutes of activity on most, if not all days of the week.  Eat for heart health. Choose a diet that is low in saturated fat and cholesterol and eliminate trans fat. Include whole grains, vegetables, and fruits. Read and understand the labels on food containers before buying.  Know your numbers. Ask your caregiver to check your blood pressure, cholesterol (total, HDL, LDL, triglycerides) and blood glucose. Work with your caregiver on improving your entire clinical picture.  High blood pressure. Limit or stop your table salt intake (try salt substitute and food seasonings). Avoid salty foods and drinks. Read labels on food containers before buying. Eating well and exercising can help control high blood pressure. STROKE  Stroke is a medical emergency. Stroke may be the result of a blood clot in a blood vessel in the brain or by a brain hemorrhage (bleeding). Know the signs and symptoms of a stroke. To lower the risk of developing a stroke:  Avoid fatty foods.  Quit smoking.  Control your diabetes, blood pressure, and irregular heart rate. THROMBOPHLEBITIS  (BLOOD CLOT) OF THE LEG  Becoming overweight and leading a stationary lifestyle may also contribute to developing blood clots. Controlling your diet and exercising will help lower the risk of developing blood clots. CANCER SCREENING  Breast Cancer: Take steps to reduce your risk of breast cancer.  You should practice "breast self-awareness." This means understanding the normal appearance and feel of your breasts and should include breast self-examination. Any changes detected, no matter how small, should be reported to your caregiver.  After age 40, you should have a clinical breast exam (CBE) every year.  Starting at age 40, you should consider having a mammogram (breast X-ray) every year.  If you have a family history of breast cancer, talk to your caregiver about genetic screening.  If you are at high risk for breast cancer, talk to your caregiver about having an MRI and a mammogram every year.  Intestinal or Stomach Cancer: Tests to consider are a rectal exam, fecal occult blood, sigmoidoscopy, and colonoscopy. Women who are high risk may need to be screened at an earlier age and more often.  Cervical Cancer:  Beginning at age 30, you should have a Pap test every 3 years as long as the past 3 Pap tests have been normal.  If you have had past treatment for cervical cancer or a condition that could lead to cancer, you need Pap tests and screening for cancer for at least 20 years after your treatment.  If you had a hysterectomy for a problem that was not cancer or a condition that could lead to cancer, then you no longer need Pap tests.    If you are between ages 65 and 70, and you have had normal Pap tests going back 10 years, you no longer need Pap tests.  If Pap tests have been discontinued, risk factors (such as a new sexual partner) need to be reassessed to determine if screening should be resumed.  Some medical problems can increase the chance of getting cervical cancer. In these  cases, your caregiver may recommend more frequent screening and Pap tests.  Uterine Cancer: If you have vaginal bleeding after reaching menopause, you should notify your caregiver.  Ovarian cancer: Other than yearly pelvic exams, there are no reliable tests available to screen for ovarian cancer at this time except for yearly pelvic exams.  Lung Cancer: Yearly chest X-rays can detect lung cancer and should be done on high risk women, such as cigarette smokers and women with chronic lung disease (emphysema).  Skin Cancer: A complete body skin exam should be done at your yearly examination. Avoid overexposure to the sun and ultraviolet light lamps. Use a strong sun block cream when in the sun. All of these things are important in lowering the risk of skin cancer. MENOPAUSE Menopause Symptoms: Hormone therapy products are effective for treating symptoms associated with menopause:  Moderate to severe hot flashes.  Night sweats.  Mood swings.  Headaches.  Tiredness.  Loss of sex drive.  Insomnia.  Other symptoms. Hormone replacement carries certain risks, especially in older women. Women who use or are thinking about using estrogen or estrogen with progestin treatments should discuss that with their caregiver. Your caregiver will help you understand the benefits and risks. The ideal dose of hormone replacement therapy is not known. The Food and Drug Administration (FDA) has concluded that hormone therapy should be used only at the lowest doses and for the shortest amount of time to reach treatment goals.  OSTEOPOROSIS Protecting Against Bone Loss and Preventing Fracture: If you use hormone therapy for prevention of bone loss (osteoporosis), the risks for bone loss must outweigh the risk of the therapy. Ask your caregiver about other medications known to be safe and effective for preventing bone loss and fractures. To guard against bone loss or fractures, the following is recommended:  If  you are less than age 50, take 1000 mg of calcium and at least 600 mg of Vitamin D per day.  If you are greater than age 50 but less than age 70, take 1200 mg of calcium and at least 600 mg of Vitamin D per day.  If you are greater than age 70, take 1200 mg of calcium and at least 800 mg of Vitamin D per day. Smoking and excessive alcohol intake increases the risk of osteoporosis. Eat foods rich in calcium and vitamin D and do weight bearing exercises several times a week as your caregiver suggests. DIABETES Diabetes Melitus: If you have Type I or Type 2 diabetes, you should keep your blood sugar under control with diet, exercise and recommended medication. Avoid too many sweets, starchy and fatty foods. Being overweight can make control more difficult. COGNITION AND MEMORY Cognition and Memory: Menopausal hormone therapy is not recommended for the prevention of cognitive disorders such as Alzheimer's disease or memory loss.  DEPRESSION  Depression may occur at any age, but is common in elderly women. The reasons may be because of physical, medical, social (loneliness), or financial problems and needs. If you are experiencing depression because of medical problems and control of symptoms, talk to your caregiver about this. Physical activity and   exercise may help with mood and sleep. Community and volunteer involvement may help your sense of value and worth. If you have depression and you feel that the problem is getting worse or becoming severe, talk to your caregiver about treatment options that are best for you. ACCIDENTS  Accidents are common and can be serious in the elderly woman. Prepare your house to prevent accidents. Eliminate throw rugs, place hand bars in the bath, shower and toilet areas. Avoid wearing high heeled shoes or walking on wet, snowy, and icy areas. Limit or stop driving if you have vision or hearing problems, or you feel you are unsteady with you movements and  reflexes. HEPATITIS C Hepatitis C is a type of viral infection affecting the liver. It is spread mainly through contact with blood from an infected person. It can be treated, but if left untreated, it can lead to severe liver damage over years. Many people who are infected do not know that the virus is in their blood. If you are a "baby-boomer", it is recommended that you have one screening test for Hepatitis C. IMMUNIZATIONS  Several immunizations are important to consider having during your senior years, including:   Tetanus, diptheria, and pertussis booster shot.  Influenza every year before the flu season begins.  Pneumonia vaccine.  Shingles vaccine.  Others as indicated based on your specific needs. Talk to your caregiver about these. Document Released: 04/05/2005 Document Revised: 01/29/2012 Document Reviewed: 11/30/2007 [employer] Patient Information 2014 [employer].  

## 2012-11-06 NOTE — Addendum Note (Signed)
Addended by: Richardson Chiquito on: 11/06/2012 12:28 PM   Modules accepted: Orders

## 2012-11-07 LAB — URINALYSIS W MICROSCOPIC + REFLEX CULTURE
Bacteria, UA: NONE SEEN
Bilirubin Urine: NEGATIVE
Casts: NONE SEEN
Glucose, UA: NEGATIVE mg/dL
Hgb urine dipstick: NEGATIVE
Ketones, ur: NEGATIVE mg/dL
pH: 5 (ref 5.0–8.0)

## 2012-11-08 LAB — URINE CULTURE
Colony Count: NO GROWTH
Organism ID, Bacteria: NO GROWTH

## 2012-11-09 ENCOUNTER — Encounter: Payer: Self-pay | Admitting: Gynecology

## 2012-11-10 ENCOUNTER — Other Ambulatory Visit: Payer: Self-pay | Admitting: Women's Health

## 2012-11-10 ENCOUNTER — Other Ambulatory Visit: Payer: Self-pay | Admitting: Gynecology

## 2012-11-10 DIAGNOSIS — R928 Other abnormal and inconclusive findings on diagnostic imaging of breast: Secondary | ICD-10-CM

## 2012-11-24 ENCOUNTER — Ambulatory Visit
Admission: RE | Admit: 2012-11-24 | Discharge: 2012-11-24 | Disposition: A | Discharge status: Home / Self Care | Payer: Managed Care, Other (non HMO) | Source: Ambulatory Visit | Attending: Women's Health | Admitting: Women's Health

## 2012-11-24 DIAGNOSIS — R928 Other abnormal and inconclusive findings on diagnostic imaging of breast: Secondary | ICD-10-CM

## 2013-12-27 ENCOUNTER — Encounter: Payer: Self-pay | Admitting: Women's Health

## 2014-03-22 ENCOUNTER — Encounter: Payer: Self-pay | Admitting: Family Medicine

## 2014-03-22 ENCOUNTER — Ambulatory Visit (INDEPENDENT_AMBULATORY_CARE_PROVIDER_SITE_OTHER): Payer: BLUE CROSS/BLUE SHIELD | Admitting: Family Medicine

## 2014-03-22 VITALS — BP 99/65 | HR 58 | Temp 97.8°F | Resp 16 | Ht 64.5 in | Wt 186.0 lb

## 2014-03-22 DIAGNOSIS — Z130 Encounter for screening for diseases of the blood and blood-forming organs and certain disorders involving the immune mechanism: Secondary | ICD-10-CM

## 2014-03-22 DIAGNOSIS — E669 Obesity, unspecified: Secondary | ICD-10-CM

## 2014-03-22 DIAGNOSIS — N760 Acute vaginitis: Secondary | ICD-10-CM

## 2014-03-22 DIAGNOSIS — Z1389 Encounter for screening for other disorder: Secondary | ICD-10-CM

## 2014-03-22 DIAGNOSIS — A499 Bacterial infection, unspecified: Secondary | ICD-10-CM

## 2014-03-22 DIAGNOSIS — N898 Other specified noninflammatory disorders of vagina: Secondary | ICD-10-CM

## 2014-03-22 DIAGNOSIS — B9689 Other specified bacterial agents as the cause of diseases classified elsewhere: Secondary | ICD-10-CM

## 2014-03-22 DIAGNOSIS — Z23 Encounter for immunization: Secondary | ICD-10-CM

## 2014-03-22 LAB — CBC
HCT: 39.5 % (ref 36.0–46.0)
HEMOGLOBIN: 14.1 g/dL (ref 12.0–15.0)
MCH: 32 pg (ref 26.0–34.0)
MCHC: 35.7 g/dL (ref 30.0–36.0)
MCV: 89.6 fL (ref 78.0–100.0)
MPV: 9.9 fL (ref 8.6–12.4)
Platelets: 217 10*3/uL (ref 150–400)
RBC: 4.41 MIL/uL (ref 3.87–5.11)
RDW: 13.2 % (ref 11.5–15.5)
WBC: 4 10*3/uL (ref 4.0–10.5)

## 2014-03-22 LAB — COMPREHENSIVE METABOLIC PANEL
ALK PHOS: 70 U/L (ref 39–117)
ALT: 34 U/L (ref 0–35)
AST: 28 U/L (ref 0–37)
Albumin: 4.2 g/dL (ref 3.5–5.2)
BILIRUBIN TOTAL: 0.5 mg/dL (ref 0.2–1.2)
BUN: 13 mg/dL (ref 6–23)
CALCIUM: 9.1 mg/dL (ref 8.4–10.5)
CHLORIDE: 105 meq/L (ref 96–112)
CO2: 26 mEq/L (ref 19–32)
CREATININE: 0.73 mg/dL (ref 0.50–1.10)
GLUCOSE: 87 mg/dL (ref 70–99)
POTASSIUM: 4.1 meq/L (ref 3.5–5.3)
Sodium: 138 mEq/L (ref 135–145)
TOTAL PROTEIN: 6.5 g/dL (ref 6.0–8.3)

## 2014-03-22 LAB — LIPID PANEL
Cholesterol: 260 mg/dL — ABNORMAL HIGH (ref 0–200)
HDL: 52 mg/dL (ref >39)
LDL Cholesterol: 177 mg/dL — ABNORMAL HIGH (ref 0–99)
Total CHOL/HDL Ratio: 5 Ratio
Triglycerides: 155 mg/dL — ABNORMAL HIGH (ref <150)
VLDL: 31 mg/dL (ref 0–40)

## 2014-03-22 LAB — POCT WET PREP WITH KOH
KOH Prep POC: NEGATIVE
RBC Wet Prep HPF POC: NEGATIVE
Trichomonas, UA: NEGATIVE
Yeast Wet Prep HPF POC: NEGATIVE

## 2014-03-22 LAB — HEMOGLOBIN A1C
Hgb A1c MFr Bld: 5.2 % (ref <5.7)
Mean Plasma Glucose: 103 mg/dL (ref <117)

## 2014-03-22 LAB — TSH: TSH: 2.376 u[IU]/mL (ref 0.350–4.500)

## 2014-03-22 MED ORDER — METRONIDAZOLE 500 MG PO TABS: 500.0000 mg | ORAL_TABLET | Freq: Two times a day (BID) | ORAL | Status: DC

## 2014-03-22 NOTE — Patient Instructions (Signed)
Aim for 1/2 pound weight loss per week Increase activity- work on core strength and stretching      Why follow it? Research shows. . Those who follow the Mediterranean diet have a reduced risk of heart disease  . The diet is associated with a reduced incidence of Parkinson's and Alzheimer's diseases . People following the diet may have longer life expectancies and lower rates of chronic diseases  . The Dietary Guidelines for Americans recommends the Mediterranean diet as an eating plan to promote health and prevent disease  What Is the Mediterranean Diet?  . Healthy eating plan based on typical foods and recipes of Mediterranean-style cooking . The diet is primarily a plant based diet; these foods should make up a majority of meals   Starches - Plant based foods should make up a majority of meals - They are an important sources of vitamins, minerals, energy, antioxidants, and fiber - Choose whole grains, foods high in fiber and minimally processed items  - Typical grain sources include wheat, oats, barley, corn, brown rice, bulgar, farro, millet, polenta, couscous  - Various types of beans include chickpeas, lentils, fava beans, black beans, white beans   Fruits  Veggies - Large quantities of antioxidant rich fruits & veggies; 6 or more servings  - Vegetables can be eaten raw or lightly drizzled with oil and cooked  - Vegetables common to the traditional Mediterranean Diet include: artichokes, arugula, beets, broccoli, brussel sprouts, cabbage, carrots, celery, collard greens, cucumbers, eggplant, kale, leeks, lemons, lettuce, mushrooms, okra, onions, peas, peppers, potatoes, pumpkin, radishes, rutabaga, shallots, spinach, sweet potatoes, turnips, zucchini - Fruits common to the Mediterranean Diet include: apples, apricots, avocados, cherries, clementines, dates, figs, grapefruits, grapes, melons, nectarines, oranges, peaches, pears, pomegranates, strawberries, tangerines  Fats - Replace  butter and margarine with healthy oils, such as olive oil, canola oil, and tahini  - Limit nuts to no more than a handful a day  - Nuts include walnuts, almonds, pecans, pistachios, pine nuts  - Limit or avoid candied, honey roasted or heavily salted nuts - Olives are central to the [employer] - can be eaten whole or used in a variety of dishes   Meats Protein - Limiting red meat: no more than a few times a month - When eating red meat: choose lean cuts and keep the portion to the size of deck of cards - Eggs: approx. 0 to 4 times a week  - Fish and lean poultry: at least 2 a week  - Healthy protein sources include, chicken, [place], lean beef, lamb - Increase intake of seafood such as tuna, salmon, trout, mackerel, shrimp, scallops - Avoid or limit high fat processed meats such as sausage and bacon  Dairy - Include moderate amounts of low fat dairy products  - Focus on healthy dairy such as fat free yogurt, skim milk, low or reduced fat cheese - Limit dairy products higher in fat such as whole or 2% milk, cheese, ice cream  Alcohol - Moderate amounts of red wine is ok  - No more than 5 oz daily for women (all ages) and men older than age 65  - No more than 10 oz of wine daily for men younger than 42  Other - Limit sweets and other desserts  - Use herbs and spices instead of salt to flavor foods  - Herbs and spices common to the traditional Mediterranean Diet include: basil, bay leaves, chives, cloves, cumin, fennel, garlic, lavender, marjoram, mint, oregano, parsley, pepper, rosemary,  sage, savory, sumac, tarragon, thyme   It's not just a diet, it's a lifestyle:  . The Mediterranean diet includes lifestyle factors typical of those in the region  . Foods, drinks and meals are best eaten with others and savored . Daily physical activity is important for overall good health . This could be strenuous exercise like running and aerobics . This could also be more leisurely activities such  as walking, housework, yard-work, or taking the stairs . Moderation is the key; a balanced and healthy diet accommodates most foods and drinks . Consider portion sizes and frequency of consumption of certain foods   Meal Ideas & Options:  . Breakfast:  o Whole wheat toast or whole wheat English muffins with peanut butter & hard boiled egg o Steel cut oats topped with apples & cinnamon and skim milk  o Fresh fruit: banana, strawberries, melon, berries, peaches  o Smoothies: strawberries, bananas, greek yogurt, peanut butter o Low fat greek yogurt with blueberries and granola  o Egg white omelet with spinach and mushrooms o Breakfast couscous: whole wheat couscous, apricots, skim milk, cranberries  . Sandwiches:  o Hummus and grilled vegetables (peppers, zucchini, squash) on whole wheat bread   o Grilled chicken on whole wheat pita with lettuce, tomatoes, cucumbers or tzatziki  o Tuna salad on whole wheat bread: tuna salad made with greek yogurt, olives, red peppers, capers, green onions o Garlic rosemary lamb pita: lamb sauted with garlic, rosemary, salt & pepper; add lettuce, cucumber, greek yogurt to pita - flavor with lemon juice and black pepper  . Seafood:  o Mediterranean grilled salmon, seasoned with garlic, basil, parsley, lemon juice and black pepper o Shrimp, lemon, and spinach whole-grain pasta salad made with low fat greek yogurt  o Seared scallops with lemon orzo  o Seared tuna steaks seasoned salt, pepper, coriander topped with tomato mixture of olives, tomatoes, olive oil, minced garlic, parsley, green onions and cappers  . Meats:  o Herbed greek chicken salad with kalamata olives, cucumber, feta  o Red bell peppers stuffed with spinach, bulgur, lean ground beef (or lentils) & topped with feta   o Kebabs: skewers of chicken, tomatoes, onions, zucchini, squash  o [place] burgers: made with red onions, mint, dill, lemon juice, feta cheese topped with roasted red  peppers . Vegetarian o Cucumber salad: cucumbers, artichoke hearts, celery, red onion, feta cheese, tossed in olive oil & lemon juice  o Hummus and whole grain pita points with a greek salad (lettuce, tomato, feta, olives, cucumbers, red onion) o Lentil soup with celery, carrots made with vegetable broth, garlic, salt and pepper  o Tabouli salad: parsley, bulgur, mint, scallions, cucumbers, tomato, radishes, lemon juice, olive oil, salt and pepper.

## 2014-03-22 NOTE — Progress Notes (Signed)
Subjective:    Patient ID: Julia Warren, female    DOB: February 27, 1955, 59 y.o.   MRN: [id number]  HPI Patient presents today for wellness form completion for her insurance.  She sees her gyn regularly is up to date on pap and mammo. She has had 2 colonoscopies. Screening was recommended in 10 years following her last procedure.  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 She enjoys her work. Her job requires that she be on her feet for long hours and do some heavy lifting. She denies back or neck pain. She has chronic left knee pain which does not interfere with her ability to work, but does decrease her willingness to exercise.   She has regular eye and dental care.   She is married Eddie Dibbles) and has 5 granddaughters.   She has occasional vaginal yeast infections and is currently having some thin, fishy smelling discharge. No itching or burning.   Review of Systems  Constitutional: Negative for fever, chills, fatigue and unexpected weight change.  Respiratory: Negative for cough, shortness of breath and wheezing.   Cardiovascular: Negative for chest pain and leg swelling.  Gastrointestinal: Negative for nausea, vomiting, abdominal pain, diarrhea and constipation.  Genitourinary: Positive for vaginal discharge. Negative for dysuria.  Neurological: Negative for headaches.  Psychiatric/Behavioral: Positive for sleep disturbance (Husband snores and cats wake her.).      Objective:   Physical Exam  Constitutional: She appears well-developed and well-nourished. No distress.  HENT:  Head: Normocephalic and atraumatic.  Right Ear: External ear normal.  Left Ear: External ear normal.  Nose: Nose normal.  Mouth/Throat: Oropharynx is clear and moist.  Eyes: Conjunctivae are normal. Pupils are equal, round, and reactive to light.  Neck: Normal range of motion. Neck supple. No thyromegaly present.  Cardiovascular: Normal rate, regular rhythm and normal  heart sounds.   Pulmonary/Chest: Effort normal and breath sounds normal.  Abdominal: Soft. Bowel sounds are normal.  Musculoskeletal: Normal range of motion. She exhibits no edema or tenderness.  Lymphadenopathy:    She has no cervical adenopathy.  Neurological: She is alert.  Skin: Skin is warm and dry. She is not diaphoretic.  Psychiatric: She has a normal mood and affect. Her behavior is normal. Judgment and thought content normal.  Vitals reviewed.  BP 99/65 mmHg  Pulse 58  Temp(Src) 97.8 F (36.6 C) (Oral)  Resp 16  Ht 5' 4.5" (1.638 m)  Wt 186 lb (84.369 kg)  BMI 31.45 kg/m2  SpO2 99%  LMP 08/14/2009 Results for orders placed or performed in visit on 03/22/14  POCT Wet Prep with KOH  Result Value Ref Range   Trichomonas, UA Negative    Clue Cells Wet Prep HPF POC tntc    Epithelial Wet Prep HPF POC tntc    Yeast Wet Prep HPF POC neg    Bacteria Wet Prep HPF POC 4+    RBC Wet Prep HPF POC neg    WBC Wet Prep HPF POC tntc    KOH Prep POC Negative       Assessment & Plan:  1. Obesity - Lipid panel - TSH - Hemoglobin A1c - Provided written and verbal information regarding healthy eating and increased activity. Encouraged 1/2 pound weekly weight loss.  2. Vaginal discharge - POCT Wet Prep with KOH  3. Screening for deficiency anemia - CBC  4. Screening for nephropathy - Comprehensive metabolic panel  5. Need for prophylactic vaccination with combined diphtheria-tetanus-pertussis (DTP) vaccine -  Tdap vaccine greater than or equal to 7yo IM  6. BV (bacterial vaginosis) - metroNIDAZOLE (FLAGYL) 500 MG tablet; Take 1 tablet (500 mg total) by mouth 2 (two) times daily.  Dispense: 14 tablet; Refill: 0 - Instructed patient to avoid alcohol while taking and for 24 hours after finishing.  Elby Beck, FNP-BC  [facility]  03/22/2014 11:06 AM

## 2014-09-29 ENCOUNTER — Other Ambulatory Visit: Payer: Self-pay | Admitting: Women's Health

## 2014-09-29 ENCOUNTER — Other Ambulatory Visit: Payer: Self-pay

## 2014-09-29 DIAGNOSIS — N6310 Unspecified lump in the right breast, unspecified quadrant: Secondary | ICD-10-CM

## 2014-10-05 ENCOUNTER — Ambulatory Visit
Admission: RE | Admit: 2014-10-05 | Discharge: 2014-10-05 | Disposition: A | Discharge status: Home / Self Care | Payer: BLUE CROSS/BLUE SHIELD | Source: Ambulatory Visit | Attending: Women's Health | Admitting: Women's Health

## 2014-10-05 DIAGNOSIS — N6310 Unspecified lump in the right breast, unspecified quadrant: Secondary | ICD-10-CM

## 2014-10-19 ENCOUNTER — Encounter: Payer: Self-pay | Admitting: Women's Health

## 2014-10-19 ENCOUNTER — Other Ambulatory Visit (HOSPITAL_COMMUNITY)
Admission: RE | Admit: 2014-10-19 | Discharge: 2014-10-19 | Disposition: A | Discharge status: Home / Self Care | Payer: BLUE CROSS/BLUE SHIELD | Source: Ambulatory Visit | Attending: Women's Health | Admitting: Women's Health

## 2014-10-19 ENCOUNTER — Ambulatory Visit (INDEPENDENT_AMBULATORY_CARE_PROVIDER_SITE_OTHER): Payer: BLUE CROSS/BLUE SHIELD | Admitting: Women's Health

## 2014-10-19 VITALS — BP 132/80 | Ht 64.0 in | Wt 183.0 lb

## 2014-10-19 DIAGNOSIS — Z1151 Encounter for screening for human papillomavirus (HPV): Secondary | ICD-10-CM | POA: Insufficient documentation

## 2014-10-19 DIAGNOSIS — B009 Herpesviral infection, unspecified: Secondary | ICD-10-CM

## 2014-10-19 DIAGNOSIS — K640 First degree hemorrhoids: Secondary | ICD-10-CM | POA: Diagnosis not present

## 2014-10-19 DIAGNOSIS — Z01419 Encounter for gynecological examination (general) (routine) without abnormal findings: Secondary | ICD-10-CM | POA: Insufficient documentation

## 2014-10-19 DIAGNOSIS — Z13820 Encounter for screening for osteoporosis: Secondary | ICD-10-CM | POA: Diagnosis not present

## 2014-10-19 LAB — COMPREHENSIVE METABOLIC PANEL
ALK PHOS: 63 U/L (ref 33–130)
ALT: 21 U/L (ref 6–29)
AST: 19 U/L (ref 10–35)
Albumin: 4.2 g/dL (ref 3.6–5.1)
BUN: 16 mg/dL (ref 7–25)
CO2: 27 mmol/L (ref 20–31)
Calcium: 9.5 mg/dL (ref 8.6–10.4)
Chloride: 104 mmol/L (ref 98–110)
Creat: 0.8 mg/dL (ref 0.50–1.05)
Glucose, Bld: 90 mg/dL (ref 65–99)
POTASSIUM: 4.6 mmol/L (ref 3.5–5.3)
Sodium: 140 mmol/L (ref 135–146)
TOTAL PROTEIN: 6.6 g/dL (ref 6.1–8.1)
Total Bilirubin: 0.5 mg/dL (ref 0.2–1.2)

## 2014-10-19 LAB — LIPID PANEL
CHOLESTEROL: 248 mg/dL — AB (ref 125–200)
HDL: 55 mg/dL (ref >=46)
LDL Cholesterol: 161 mg/dL — ABNORMAL HIGH (ref <130)
Total CHOL/HDL Ratio: 4.5 Ratio (ref <=5.0)
Triglycerides: 159 mg/dL — ABNORMAL HIGH (ref <150)
VLDL: 32 mg/dL — ABNORMAL HIGH (ref <30)

## 2014-10-19 LAB — TSH: TSH: 2.116 u[IU]/mL (ref 0.350–4.500)

## 2014-10-19 LAB — CBC WITH DIFFERENTIAL/PLATELET
Basophils Absolute: 0 10*3/uL (ref 0.0–0.1)
Basophils Relative: 1 % (ref 0–1)
EOS ABS: 0.1 10*3/uL (ref 0.0–0.7)
Eosinophils Relative: 2 % (ref 0–5)
HCT: 40.8 % (ref 36.0–46.0)
HEMOGLOBIN: 13.8 g/dL (ref 12.0–15.0)
LYMPHS ABS: 1.6 10*3/uL (ref 0.7–4.0)
LYMPHS PCT: 40 % (ref 12–46)
MCH: 30.9 pg (ref 26.0–34.0)
MCHC: 33.8 g/dL (ref 30.0–36.0)
MCV: 91.5 fL (ref 78.0–100.0)
MONOS PCT: 7 % (ref 3–12)
MPV: 9.9 fL (ref 8.6–12.4)
Monocytes Absolute: 0.3 10*3/uL (ref 0.1–1.0)
NEUTROS ABS: 2.1 10*3/uL (ref 1.7–7.7)
NEUTROS PCT: 50 % (ref 43–77)
Platelets: 216 10*3/uL (ref 150–400)
RBC: 4.46 MIL/uL (ref 3.87–5.11)
RDW: 13.5 % (ref 11.5–15.5)
WBC: 4.1 10*3/uL (ref 4.0–10.5)

## 2014-10-19 MED ORDER — DESONIDE 0.05 % EX CREA: TOPICAL_CREAM | CUTANEOUS | Status: DC

## 2014-10-19 MED ORDER — HYDROCORTISONE ACE-PRAMOXINE 2.5-1 % RE CREA: 1.0000 "application " | TOPICAL_CREAM | Freq: Three times a day (TID) | RECTAL | Status: AC

## 2014-10-19 MED ORDER — VALACYCLOVIR HCL 500 MG PO TABS: ORAL_TABLET | ORAL | Status: DC

## 2014-10-19 NOTE — Progress Notes (Signed)
Julia Warren 10/07/1955 [id number]    History:    Presents for annual exam.  Postmenopausal/no HRT/no bleeding. Normal Pap and mammogram history. 2012 hysteroscope fibroid removed no bleeding since. 2014 negative colonoscopy. 2012 normal DEXA. HSV 2 rare outbreaks. 2015 right knee arthroscopic surgery.  Past medical history, past surgical history, family history and social history were all reviewed and documented in the EPIC chart. 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 2 daughters 5 grandchildren all doing well. Mother, brother diabetes mother died age 45.  ROS:  A ROS was performed and pertinent positives and negatives are included.  Exam:  Filed Vitals:   10/19/14 0833  BP: 132/80    General appearance:  Normal Thyroid:  Symmetrical, normal in size, without palpable masses or nodularity. Respiratory  Auscultation:  Clear without wheezing or rhonchi Cardiovascular  Auscultation:  Regular rate, without rubs, murmurs or gallops  Edema/varicosities:  Not grossly evident Abdominal  Soft,nontender, without masses, guarding or rebound.  Liver/spleen:  No organomegaly noted  Hernia:  None appreciated  Skin  Inspection:  Grossly normal   Breasts: Examined lying and sitting.     Right: Without masses, retractions, discharge or axillary adenopathy.     Left: Without masses, retractions, discharge or axillary adenopathy. Gentitourinary   Inguinal/mons:  Normal without inguinal adenopathy  External genitalia:  Normal  BUS/Urethra/Skene's glands:  Normal  Vagina:  Normal  Cervix:  Normal  Uterus:   normal in size, shape and contour.  Midline and mobile  Adnexa/parametria:     Rt: Without masses or tenderness.   Lt: Without masses or tenderness.  Anus and perineum: Small hemorrhoids  Digital rectal exam: Normal sphincter tone without palpated masses or tenderness  Assessment/Plan:  59 y.o. MWF G2P2 +1 adopted son for annual exam with no  complaints.  Postmenopausal/no HRT/no bleeding HSV rare outbreaks Valtrex when necessary  Plan: Valtrex 500 twice daily 3-5 days when necessary prescription, proper use given and reviewed. Analpram cream use when necessary rectally prescription, proper use given and reviewed. SBE's, continue annual screening mammogram, calcium rich diet, vitamin D 2000 daily. Zostavax recommended at age 78. CBC, CMP, lipid panel, TSH, vitamin D, UA, Pap with HR HPV typing, new screening guidelines reviewed.   [place], 9:15 AM 10/19/2014

## 2014-10-19 NOTE — Addendum Note (Signed)
Addended by: Burnett Kanaris on: 10/19/2014 09:30 AM   Modules accepted: Orders

## 2014-10-19 NOTE — Patient Instructions (Signed)

## 2014-10-20 ENCOUNTER — Other Ambulatory Visit: Payer: Self-pay | Admitting: Women's Health

## 2014-10-20 DIAGNOSIS — E7800 Pure hypercholesterolemia, unspecified: Secondary | ICD-10-CM

## 2014-10-20 LAB — CYTOLOGY - PAP

## 2014-10-20 LAB — URINALYSIS W MICROSCOPIC + REFLEX CULTURE
BILIRUBIN URINE: NEGATIVE
Bacteria, UA: NONE SEEN [HPF]
CASTS: NONE SEEN [LPF]
CRYSTALS: NONE SEEN [HPF]
Glucose, UA: NEGATIVE
HGB URINE DIPSTICK: NEGATIVE
KETONES UR: NEGATIVE
Nitrite: NEGATIVE
Protein, ur: NEGATIVE
RBC / HPF: NONE SEEN RBC/HPF (ref <=2)
SPECIFIC GRAVITY, URINE: 1.019 (ref 1.001–1.035)
Yeast: NONE SEEN [HPF]
pH: 5 (ref 5.0–8.0)

## 2014-10-20 LAB — VITAMIN D 25 HYDROXY (VIT D DEFICIENCY, FRACTURES): Vit D, 25-Hydroxy: 19 ng/mL — ABNORMAL LOW (ref 30–100)

## 2014-10-20 MED ORDER — VITAMIN D (ERGOCALCIFEROL) 1.25 MG (50000 UNIT) PO CAPS: 50000.0000 [IU] | ORAL_CAPSULE | ORAL | Status: DC

## 2014-10-21 LAB — URINE CULTURE
Colony Count: NO GROWTH
Organism ID, Bacteria: NO GROWTH

## 2014-11-02 ENCOUNTER — Other Ambulatory Visit: Payer: Self-pay | Admitting: Gynecology

## 2014-11-02 DIAGNOSIS — Z13820 Encounter for screening for osteoporosis: Secondary | ICD-10-CM

## 2014-11-22 ENCOUNTER — Other Ambulatory Visit: Payer: Self-pay | Admitting: Gynecology

## 2014-11-22 ENCOUNTER — Ambulatory Visit (INDEPENDENT_AMBULATORY_CARE_PROVIDER_SITE_OTHER): Payer: BLUE CROSS/BLUE SHIELD

## 2014-11-22 DIAGNOSIS — M8580 Other specified disorders of bone density and structure, unspecified site: Secondary | ICD-10-CM

## 2014-11-22 DIAGNOSIS — M899 Disorder of bone, unspecified: Secondary | ICD-10-CM

## 2014-11-22 DIAGNOSIS — Z13820 Encounter for screening for osteoporosis: Secondary | ICD-10-CM

## 2015-01-23 ENCOUNTER — Encounter: Payer: Self-pay | Admitting: Internal Medicine

## 2015-03-22 ENCOUNTER — Ambulatory Visit (INDEPENDENT_AMBULATORY_CARE_PROVIDER_SITE_OTHER): Payer: BLUE CROSS/BLUE SHIELD | Admitting: Physical Therapy

## 2015-03-22 ENCOUNTER — Encounter: Payer: Self-pay | Admitting: Physical Therapy

## 2015-03-22 DIAGNOSIS — M25561 Pain in right knee: Secondary | ICD-10-CM

## 2015-03-22 DIAGNOSIS — R29898 Other symptoms and signs involving the musculoskeletal system: Secondary | ICD-10-CM

## 2015-03-22 DIAGNOSIS — M25562 Pain in left knee: Secondary | ICD-10-CM | POA: Diagnosis not present

## 2015-03-22 DIAGNOSIS — R293 Abnormal posture: Secondary | ICD-10-CM | POA: Diagnosis not present

## 2015-03-22 NOTE — Patient Instructions (Signed)
  Straight Leg Raise: With External Leg Rotation  [place] [phone]    Lie on back with right leg straight, opposite leg bent. Rotate straight leg out and lift _10-12___ inches. Repeat _10___ times per set. Do __3__ sets per session. Do __1__ sessions per day. Repeat on the left leg   Quads / HF, Prone    Lie face down, knees together. Grasp one ankle with same-side hand. Use towel if needed to reach. Gently pull foot toward buttock. Hold _30__ seconds. Repeat _1-2__ times per session. Do __1_ sessions per day.  Copyright  VHI. All rights reserved.

## 2015-03-22 NOTE — Therapy (Signed)
[place], [place], [address] Phone: [phone]   Fax:  [phone]  Physical Therapy Evaluation  Patient Details  Name: Julia Warren MRN: [id number] Date of Birth: Mar 31, 1955 Referring Provider: Dr Ihor Dow  Encounter Date: 03/22/2015      PT End of Session - 03/22/15 1520    Visit Number 1   PT Start Time 1520   PT Stop Time 1605   PT Time Calculation (min) 45 min   Activity Tolerance Patient tolerated treatment well      Past Medical History  Diagnosis Date  . Tubal ligation status     Past Surgical History  Procedure Laterality Date  . Tubal ligation  1982  . Appendectomy  1967  . Hysteroscopy  10.10.2012    w/REMOVAL OF MYOMECTOMY  . Knee surgery      There were no vitals filed for this visit.  Visit Diagnosis:  Bilateral knee pain - Plan: PT plan of care cert/re-cert  Weakness of both legs - Plan: PT plan of care cert/re-cert  Posture abnormality - Plan: PT plan of care cert/re-cert      Subjective Assessment - 03/22/15 1521    Subjective Patient states that on halloween she bent over to pick up a brush and her legs slip out into a split.  The Lt knee dislocated and then relocated. She has been favoring Lt knee and now the Rt knee medial is starting to hurt since Dec. Was issued a knee immobilzer for the Lt knee, then saw ortho and was issued  a small compression brace for the Rt knee.  Stopped the Rt brace mid Nov and the Lt around Christmas. No ther ex for 6 wks, then start PT. 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 She rides the bike at home and does some SLR in the AM with ankle pumps.  Scheduled for Rt knee MRI on 03/28/15   Pertinent History Lt knee scope 2015 medial meniscus   How long can you sit comfortably? 1 hr then legs feel restless Rt > Lt   How long can you walk comfortably? pushing through the pain,   Diagnostic tests x-rays initially  (-) fx.    Patient Stated Goals get on/off commode without difficulty and pain, squat without difficulty, reduce pain and get strength back.    Currently in Pain? Yes   Pain Score 2    Pain Location Knee   Pain Orientation Left;Medial  Rt knee pain 5/10, constant medial bone bruise sensation.    Pain Descriptors / Indicators Aching;Sharp   Pain Type Acute pain   Pain Onset More than a month ago   Pain Frequency Intermittent   Aggravating Factors  settling down at night, sleep positions, stairs, squatting.    Pain Relieving Factors ice, medication,             [facility] PT Assessment - 03/22/15 0001    Assessment   Medical Diagnosis Lt & Rt knee MCL sprain   Referring Provider Dr Ihor Dow   Onset Date/Surgical Date 12/26/14   Next MD Visit 04/05/15   MRI results   Prior Therapy none   Precautions   Precautions None   Balance Screen   Has the patient fallen in the past 6 months Yes   How many times? 2  fall for injury, second one tripped over a box & on Rt  hip  Has the patient had a decrease in activity level because of a fear of falling?  No   Is the patient reluctant to leave their home because of a fear of falling?  No   Home Environment   Living Environment Private residence   Living Arrangements Spouse/significant other   Home Layout Two level  one step at a time, holds railing.    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    Observation/Other Assessments   Focus on Therapeutic Outcomes (FOTO)  66% limited   Observation/Other Assessments-Edema    Edema --  none in knees   Functional Tests   Functional tests Squat;Single leg stance   Squat   Comments shifts to the Rt , fair eccentric control   Single Leg Stance   Comments Lt & Rt  > 15 sec    Posture/Postural Control   Posture/Postural Control Postural limitations   Postural  Limitations Decreased lumbar lordosis  valgus Lt    ROM / Strength   AROM / PROM / Strength AROM;Strength   AROM   AROM Assessment Site Knee   Right/Left Knee Right;Left   Right Knee Extension 0   Right Knee Flexion 136   Left Knee Extension 0   Left Knee Flexion 141   Strength   Strength Assessment Site Knee;Hip;Ankle   Right/Left Hip Left;Right   Right Hip Flexion 4+/5   Right Hip Extension 5/5   Right Hip ABduction --  5-/5   Right Hip ADduction 4+/5   Left Hip Flexion 5/5   Left Hip Extension 5/5   Left Hip ABduction 4+/5   Right/Left Knee --  WNL   Right/Left Ankle --  WNL   Flexibility   Soft Tissue Assessment /Muscle Length yes   Quadriceps Lt 5" from bottom in prone Rt 8"    Ambulation/Gait   Ambulation/Gait Yes   Ambulation/Gait Assistance 7: Independent   Ambulation Distance (Feet) --  in the clinic   Assistive device None   Gait Pattern Antalgic;Left circumduction;Decreased stance time - right                   [facility] Adult PT Treatment/Exercise - 03/22/15 0001    Exercises   Exercises Knee/Hip   Knee/Hip Exercises: Stretches   Quad Stretch Both;1 rep;30 seconds  prone with strap   Knee/Hip Exercises: Supine   Straight Leg Raise with External Rotation Strengthening;Both;15 reps   Manual Therapy   Manual Therapy Taping   Manual therapy comments dynamic tape bilat knees for lateral patellar tracking.                 PT Education - 03/22/15 1558    Education provided Yes   Education Details HEP    Person(s) Educated Patient   Methods Handout;Demonstration;Explanation   Comprehension Returned demonstration             PT Long Term Goals - 03/22/15 1633    PT LONG TERM GOAL #1   Title I with HEP ( 04/19/15)    Time 4   Period Weeks   Status New   PT LONG TERM GOAL #2   Title perform eccentric quad work without difficulty and no knee pain ( 04/19/15)    Time 4   Period Weeks   Status New   PT LONG TERM GOAL #3   Title  report pain  decrease to allow her to sleep per her previous level ( 04/19/15)   Time 4   Period Weeks   Status New   PT LONG TERM GOAL #4   Title improve FOTO =/< 41% limited ( 04/19/15)   Time 4   Period Weeks   Status New               Plan - 03/22/15 1651    Clinical Impression Statement 60 yo female s/p a fall with subsequent injury to bilat knees.  She was in braces for 2 months and is now presenting for rehab.  Her pain is mainly medial where she sprained them.  Iyah has laterally tracking patellas and responded well to taping to reposition them. She has high level functional quad weaknes and motor control issues.    Pt will benefit from skilled therapeutic intervention in order to improve on the following deficits Decreased strength;Pain;Difficulty walking   Rehab Potential Good   PT Frequency 2x / week   PT Duration 4 weeks   PT Treatment/Interventions Vasopneumatic Device;Manual techniques;Therapeutic exercise;Moist Heat;Iontophoresis 4mg /ml Dexamethasone;Electrical Stimulation;Cryotherapy;Dry needling;Patient/family education;Ultrasound;Neuromuscular re-education   PT Next Visit Plan progress weight bearing ther ex, retape if needed.    Consulted and Agree with Plan of Care Patient         Problem List Patient Active Problem List   Diagnosis Date Noted  . HSV-2 infection 11/06/2012    Jeral Pinch PT 03/22/2015, 4:57 PM  [facility] [place], [place], [address] Phone: [phone]   Fax:  [phone]  Name: Julia Warren MRN: [id number] Date of Birth: 01-Nov-1955

## 2015-03-24 ENCOUNTER — Ambulatory Visit (INDEPENDENT_AMBULATORY_CARE_PROVIDER_SITE_OTHER): Payer: BLUE CROSS/BLUE SHIELD | Admitting: Physical Therapy

## 2015-03-24 DIAGNOSIS — M25562 Pain in left knee: Secondary | ICD-10-CM | POA: Diagnosis not present

## 2015-03-24 DIAGNOSIS — R29898 Other symptoms and signs involving the musculoskeletal system: Secondary | ICD-10-CM | POA: Diagnosis not present

## 2015-03-24 DIAGNOSIS — R293 Abnormal posture: Secondary | ICD-10-CM | POA: Diagnosis not present

## 2015-03-24 DIAGNOSIS — M25561 Pain in right knee: Secondary | ICD-10-CM | POA: Diagnosis not present

## 2015-03-24 NOTE — Therapy (Signed)
[place], [place], [address] Phone: [phone]   Fax:  [phone]  Physical Therapy Treatment  Patient Details  Name: Julia Warren MRN: [id number] Date of Birth: August 01, 1955 Referring Provider: Dr. Sydnee Cabal  Encounter Date: 03/24/2015      PT End of Session - 03/24/15 1343    Visit Number 2   PT Start Time [id number]   PT Stop Time 1434   PT Time Calculation (min) 57 min   Activity Tolerance Patient tolerated treatment well      Past Medical History  Diagnosis Date  . Tubal ligation status     Past Surgical History  Procedure Laterality Date  . Tubal ligation  1982  . Appendectomy  1967  . Hysteroscopy  10.10.2012    w/REMOVAL OF MYOMECTOMY  . Knee surgery      There were no vitals filed for this visit.  Visit Diagnosis:  Bilateral knee pain  Weakness of both legs  Posture abnormality      Subjective Assessment - 03/24/15 1344    Subjective Pt reports positive response with dynamic tape; decreased pain.  Exercises going well.   Currently in Pain? Yes   Pain Score 2    Pain Location Knee   Pain Orientation Right   Aggravating Factors  stairs    Pain Relieving Factors ice, medication, tape             [facility] PT Assessment - 03/24/15 0001    Assessment   Medical Diagnosis Lt & Rt knee MCL sprain   Referring Provider Dr. Sydnee Cabal   Onset Date/Surgical Date 12/26/14   Next MD Visit 04/05/15   MRI results            [facility] Adult PT Treatment/Exercise - 03/24/15 0001    Knee/Hip Exercises: Stretches   Passive Hamstring Stretch Right;Left;2 reps;30 seconds   Quad Stretch Right;Left;2 reps;30 seconds   Piriformis Stretch Right;Left;2 reps;30 seconds   Other Knee/Hip Stretches ITB stretch with strap x 30 sec x 2 reps   Knee/Hip Exercises: Standing   Other Standing Knee Exercises resisted side stepping 5 ft each direction (green band); caused increased  pain - stopped   Trial of heel taps from 3" step x  5 reps each side with UE support.    Knee/Hip Exercises: Sidelying   Hip ABduction Right;Left;2 sets;10 reps   Hip ADduction Strengthening;Right;Left;1 set;10 reps   Clams 1 set 10 each side    Modalities   Modalities Cryotherapy   Cryotherapy   Number Minutes Cryotherapy 15 Minutes   Cryotherapy Location Knee  bilateral   Type of Cryotherapy Ice pack   Manual Therapy   Manual Therapy Taping   Manual therapy comments dynamic tape bilat knees for lateral patellar tracking.      Long sitting SLR with ER and hip abd/add x 10 reps each leg, 2 sets        PT Education - 03/24/15 1427    Education provided Yes   Education Details Added SLR with hip abd/add.  VC for improved form on Quad stretch.    Person(s) Educated Patient   Methods Explanation;Demonstration   Comprehension Returned demonstration;Verbalized understanding             PT Long Term Goals - 03/22/15 1633    PT LONG TERM GOAL #1   Title I with HEP ( 04/19/15)    Time 4   Period Weeks   Status New  PT LONG TERM GOAL #2   Title perform eccentric quad work without difficulty and no knee pain ( 04/19/15)    Time 4   Period Weeks   Status New   PT LONG TERM GOAL #3   Title report pain decrease to allow her to sleep per her previous level ( 04/19/15)   Time 4   Period Weeks   Status New   PT LONG TERM GOAL #4   Title improve FOTO =/< 41% limited ( 04/19/15)   Time 4   Period Weeks   Status New               Plan - 03/24/15 1423    Clinical Impression Statement Pt tolerated most exercises without increase in pain, except Lt heel tap (increased Rt knee pain- 3" step) and resisted side stepping (increased Rt medial knee pain).  Pt reported decreased pain with use of dynamic tape to knees and ice at end of session.  Progressing towards goals.    Pt will benefit from skilled therapeutic intervention in order to improve on the following deficits  Decreased strength;Pain;Difficulty walking   Rehab Potential Good   PT Frequency 2x / week   PT Duration 4 weeks   PT Treatment/Interventions Vasopneumatic Device;Manual techniques;Therapeutic exercise;Moist Heat;Iontophoresis 4mg /ml Dexamethasone;Electrical Stimulation;Cryotherapy;Dry needling;Patient/family education;Ultrasound;Neuromuscular re-education   PT Next Visit Plan progress weight bearing ther ex, retape as needed.  Revisit heel taps.    Consulted and Agree with Plan of Care Patient        Problem List Patient Active Problem List   Diagnosis Date Noted  . HSV-2 infection 11/06/2012    Kerin Perna, PTA 03/24/2015 2:46 PM  [place], [place], [address] Phone: [phone]   Fax:  [phone]  Name: Julia Warren MRN: [id number] Date of Birth: [birth year]

## 2015-03-29 ENCOUNTER — Ambulatory Visit (INDEPENDENT_AMBULATORY_CARE_PROVIDER_SITE_OTHER): Payer: BLUE CROSS/BLUE SHIELD | Admitting: Physical Therapy

## 2015-03-29 DIAGNOSIS — R293 Abnormal posture: Secondary | ICD-10-CM | POA: Diagnosis not present

## 2015-03-29 DIAGNOSIS — M25561 Pain in right knee: Secondary | ICD-10-CM | POA: Diagnosis not present

## 2015-03-29 DIAGNOSIS — R29898 Other symptoms and signs involving the musculoskeletal system: Secondary | ICD-10-CM | POA: Diagnosis not present

## 2015-03-29 DIAGNOSIS — M25562 Pain in left knee: Secondary | ICD-10-CM | POA: Diagnosis not present

## 2015-03-29 NOTE — Therapy (Signed)
[place], [place], [address] Phone: [phone]   Fax:  [phone]  Physical Therapy Treatment  Patient Details  Name: Julia Warren MRN: [id number] Date of Birth: [birth year] Referring Provider: Dr. Sydnee Cabal  Encounter Date: 03/29/2015      PT End of Session - 03/29/15 1527    Visit Number 3   Number of Visits 8   Date for PT Re-Evaluation 04/19/15   PT Start Time 1527   PT Stop Time [id number]   PT Time Calculation (min) 47 min   Activity Tolerance Patient tolerated treatment well      Past Medical History  Diagnosis Date  . Tubal ligation status     Past Surgical History  Procedure Laterality Date  . Tubal ligation  1982  . Appendectomy  1967  . Hysteroscopy  10.10.2012    w/REMOVAL OF MYOMECTOMY  . Knee surgery      There were no vitals filed for this visit.  Visit Diagnosis:  Bilateral knee pain  Weakness of both legs  Posture abnormality      Subjective Assessment - 03/29/15 1545    Subjective Pt had the MRI on her Rt knee yesterday, follows up with MD next week for results.  Can still feel a big reduction in pain with taping. She is sleeping better at night however not to her baseline yet.    Currently in Pain? Yes   Pain Score 2    Pain Location Knee   Pain Orientation Right   Pain Descriptors / Indicators Aching   Pain Type Acute pain   Pain Onset More than a month ago   Pain Frequency Intermittent   Aggravating Factors  stairs    Pain Relieving Factors tape,ice                         [facility] Adult PT Treatment/Exercise - 03/29/15 0001    Knee/Hip Exercises: Stretches   Passive Hamstring Stretch Both;30 seconds  HS then hip abd/adduction   Quad Stretch Both;1 rep;30 seconds  prone with strap   Knee/Hip Exercises: Standing   Lateral Step Up Both;3 sets;10 reps;Step Height: 8"  HHA on bar   Wall Squat 10 reps  with 5 sec hold at bottom    Knee/Hip Exercises: Seated   Knee/Hip Flexion 3x10 long sit, SLR with hip abd/adduction   Knee/Hip Exercises: Supine   Bridges Limitations 2x10 with ball between knees   Single Leg Bridge Strengthening;Both;10 reps;2 sets   Manual Therapy   Manual Therapy Taping   Manual therapy comments dynamic tape bilat knees for lateral patellar tracking.                      PT Long Term Goals - 03/29/15 1544    PT LONG TERM GOAL #1   Title I with HEP ( 04/19/15)    Status On-going   PT LONG TERM GOAL #2   Title perform eccentric quad work without difficulty and no knee pain ( 04/19/15)    Status On-going   PT LONG TERM GOAL #3   Title report pain decrease to allow her to sleep per her previous level ( 04/19/15)   Status On-going   PT LONG TERM GOAL #4   Title improve FOTO =/< 41% limited ( 04/19/15)   Status On-going               Plan - 03/29/15  1609    Clinical Impression Statement This is Julia Warren's third visit. She is progressing to her goals, continues to have great results with the tape.  Reports her legs are getting stronger.  Still has difficulty and pain with weight bearing activities   Pt will benefit from skilled therapeutic intervention in order to improve on the following deficits Decreased strength;Pain;Difficulty walking   Rehab Potential Good   PT Frequency 2x / week   PT Duration 4 weeks   PT Treatment/Interventions Vasopneumatic Device;Manual techniques;Therapeutic exercise;Moist Heat;Iontophoresis 4mg /ml Dexamethasone;Electrical Stimulation;Cryotherapy;Dry needling;Patient/family education;Ultrasound;Neuromuscular re-education   PT Next Visit Plan progress HEP   Consulted and Agree with Plan of Care Patient        Problem List Patient Active Problem List   Diagnosis Date Noted  . HSV-2 infection 11/06/2012    Manuela Schwartz shaver PT 03/29/2015, 4:14 PM  [facility] [place],  [place], [address] Phone: [phone]   Fax:  [phone]  Name: Julia Warren MRN: [id number] Date of Birth: [birth year]

## 2015-03-31 ENCOUNTER — Ambulatory Visit (INDEPENDENT_AMBULATORY_CARE_PROVIDER_SITE_OTHER): Payer: BLUE CROSS/BLUE SHIELD | Admitting: Physical Therapy

## 2015-03-31 DIAGNOSIS — M25561 Pain in right knee: Secondary | ICD-10-CM | POA: Diagnosis not present

## 2015-03-31 DIAGNOSIS — R293 Abnormal posture: Secondary | ICD-10-CM | POA: Diagnosis not present

## 2015-03-31 DIAGNOSIS — R29898 Other symptoms and signs involving the musculoskeletal system: Secondary | ICD-10-CM

## 2015-03-31 DIAGNOSIS — M25562 Pain in left knee: Secondary | ICD-10-CM | POA: Diagnosis not present

## 2015-03-31 NOTE — Therapy (Signed)
[place], [place], [address] Phone: [phone]   Fax:  [phone]  Physical Therapy Treatment  Patient Details  Name: Julia Warren MRN: [id number] Date of Birth: Apr 18, 1955 Referring Provider: Dr. Micheline Maze  Encounter Date: 03/31/2015      PT End of Session - 03/31/15 1543    Visit Number 4   Number of Visits 8   Date for PT Re-Evaluation 04/19/15   PT Start Time 1450   PT Stop Time 1552   PT Time Calculation (min) 62 min   Activity Tolerance Patient tolerated treatment well      Past Medical History  Diagnosis Date  . Tubal ligation status     Past Surgical History  Procedure Laterality Date  . Tubal ligation  1982  . Appendectomy  1967  . Hysteroscopy  10.10.2012    w/REMOVAL OF MYOMECTOMY  . Knee surgery      There were no vitals filed for this visit.  Visit Diagnosis:  Bilateral knee pain  Weakness of both legs  Posture abnormality      Subjective Assessment - 03/31/15 1500    Subjective Pt reports she has had discomfort in knees with wall slide.  Tape continues to provide pain relief.  Sleeping "pretty good, more regularly".    Currently in Pain? Yes   Pain Score 3    Pain Location Knee   Pain Orientation Right   Pain Descriptors / Indicators Aching            [facility] PT Assessment - 03/31/15 0001    Assessment   Medical Diagnosis Lt & Rt knee MCL sprain   Referring Provider Dr. Micheline Maze   Onset Date/Surgical Date 12/26/14   Next MD Visit 04/05/15   MRI results           [facility] Adult PT Treatment/Exercise - 03/31/15 0001    Knee/Hip Exercises: Stretches   Passive Hamstring Stretch Both;30 seconds;3 reps  HS then hip abd/adduction   Quad Stretch 3 reps;Right;Left   Knee/Hip Exercises: Aerobic   Nustep L4: 6 min    Knee/Hip Exercises: Standing   Wall Squat --  2 reps; increased crepitus Lt, pain Rt.    SLS on blue pad with hip flex,  abd, ext with opp leg x 10 reps each leg.  forward leans to chair seat and flexed standing knee x 5 each side.    Other Standing Knee Exercises Rt TKE with green band x 5 sec hold x 12 reps    Modalities   Modalities Cryotherapy;Ultrasound   Cryotherapy   Number Minutes Cryotherapy 15 Minutes   Cryotherapy Location Knee  bilat   Type of Cryotherapy Ice pack   Ultrasound   Ultrasound Location Rt medial knee    Ultrasound Parameters 100%, 3.19mHz, 1.1 w/cm2 x 8 min    Ultrasound Goals Pain   Manual Therapy   Manual Therapy Taping   Manual therapy comments dynamic tape bilat knees for lateral patellar tracking.                 PT Education - 03/31/15 1542    Education Details HEP    Person(s) Educated Patient   Methods Explanation;Handout   Comprehension Verbalized understanding             PT Long Term Goals - 03/29/15 1544    PT LONG TERM GOAL #1   Title I with HEP ( 04/19/15)    Status On-going  PT LONG TERM GOAL #2   Title perform eccentric quad work without difficulty and no knee pain ( 04/19/15)    Status On-going   PT LONG TERM GOAL #3   Title report pain decrease to allow her to sleep per her previous level ( 04/19/15)   Status On-going   PT LONG TERM GOAL #4   Title improve FOTO =/< 41% limited ( 04/19/15)   Status On-going               Plan - 03/31/15 1633    Clinical Impression Statement Pt not tolerating wall slides; will hold on these for now. She tolerated new exercises without increased pain or symptoms in knees.  Pt reported decreased pain in Rt knee with [place] and tape.  Progressing towards goals.    Pt will benefit from skilled therapeutic intervention in order to improve on the following deficits Decreased strength;Pain;Difficulty walking   Rehab Potential Good   PT Frequency 2x / week   PT Duration 4 weeks   PT Treatment/Interventions Vasopneumatic Device;Manual techniques;Therapeutic exercise;Moist Heat;Iontophoresis 4mg /ml  Dexamethasone;Electrical Stimulation;Cryotherapy;Dry needling;Patient/family education;Ultrasound;Neuromuscular re-education   PT Next Visit Plan Assess response to [place] and repeat if positive. Continue progressive strengthening for LE.     Consulted and Agree with Plan of Care Patient        Problem List Patient Active Problem List   Diagnosis Date Noted  . HSV-2 infection 11/06/2012    Kerin Perna, PTA 03/31/2015 4:37 PM  [place], [place], [address] Phone: [phone]   Fax:  [phone]  Name: Julia Warren MRN: [id number] Date of Birth: Nov 19, 1955

## 2015-03-31 NOTE — Patient Instructions (Addendum)
Knee Extension: Terminal - Standing (Single Leg)    Face anchor in shoulder width stance, band around knee. Allow tension of band to slightly bend knee. Pull leg back, straightening knee. Repeat 10__ times per set. Repeat with other leg. Do _2_ sets per session. Do _3_ sessions per week. Anchor Height: Knee  [email or web address]     Balance: Three-Way Leg Swing    Stand on left foot, hands on hips. Reach other foot forward __10__ times, sideways _10___ times, back __10__ times. Hold each position ___1_ seconds. Relax. Keep knee slightly bent.  [email or web address]    [facility] [place], [place] [address]  [phone] (office) [phone] (fax)

## 2015-04-03 ENCOUNTER — Ambulatory Visit (INDEPENDENT_AMBULATORY_CARE_PROVIDER_SITE_OTHER): Payer: BLUE CROSS/BLUE SHIELD | Admitting: Physical Therapy

## 2015-04-03 DIAGNOSIS — M25562 Pain in left knee: Secondary | ICD-10-CM

## 2015-04-03 DIAGNOSIS — R29898 Other symptoms and signs involving the musculoskeletal system: Secondary | ICD-10-CM

## 2015-04-03 DIAGNOSIS — M25561 Pain in right knee: Secondary | ICD-10-CM

## 2015-04-03 DIAGNOSIS — R293 Abnormal posture: Secondary | ICD-10-CM

## 2015-04-03 NOTE — Therapy (Signed)
[place], [place], [address] Phone: [phone]   Fax:  [phone]  Physical Therapy Treatment  Patient Details  Name: Julia Warren MRN: [id number] Date of Birth: 1955/05/02 Referring Provider: Dr. Sydnee Cabal   Encounter Date: 04/03/2015      PT End of Session - 04/03/15 1601    Visit Number 5   Number of Visits 8   Date for PT Re-Evaluation 04/19/15   PT Start Time [id number]   PT Stop Time 1612   PT Time Calculation (min) 57 min   Activity Tolerance Patient tolerated treatment well;No increased pain      Past Medical History  Diagnosis Date  . Tubal ligation status     Past Surgical History  Procedure Laterality Date  . Tubal ligation  1982  . Appendectomy  1967  . Hysteroscopy  10.10.2012    w/REMOVAL OF MYOMECTOMY  . Knee surgery      There were no vitals filed for this visit.  Visit Diagnosis:  Bilateral knee pain  Weakness of both legs  Posture abnormality      Subjective Assessment - 04/03/15 1517    Subjective Pt reports she only performed stretches over weekend, no others.  Rt knee aches constantly.  Lt knee only hurts with weight bearing up /down stairs.  Sleep has improved 35% (still waking due to pain with leg movements in the night).     Currently in Pain? Yes   Pain Score 3    Pain Location Knee   Pain Orientation Right   Pain Descriptors / Indicators Aching   Aggravating Factors  stairs    Pain Relieving Factors tape, ice   Multiple Pain Sites Yes   Pain Score 2   Pain Location Knee   Pain Orientation Left   Aggravating Factors  stairs, inclines.     Pain Relieving Factors ice             [facility] PT Assessment - 04/03/15 0001    Assessment   Medical Diagnosis Lt & Rt knee MCL sprain   Referring Provider Dr. Sydnee Cabal    Onset Date/Surgical Date 12/26/14   Next MD Visit 04/05/15   MRI results   Strength   Strength Assessment Site Knee;Hip    Right/Left Hip Left;Right   Right Hip Flexion --  5-/5   Right Hip Extension 5/5   Left Hip Flexion 5/5   Left Hip Extension --  5-/5   Left Hip ABduction 5/5                     [facility] Adult PT Treatment/Exercise - 04/03/15 0001    Knee/Hip Exercises: Stretches   Passive Hamstring Stretch Both;30 seconds;3 reps  HS then hip abd/adduction   Quad Stretch 3 reps;Right;Left   Gastroc Stretch Right;Left;2 reps;30 seconds   Knee/Hip Exercises: Aerobic   Nustep L4: 5 min    Knee/Hip Exercises: Supine   Bridges with [employer] --  10 reps with unilateral knee ext with hip ext    Other Supine Knee/Hip Exercises long sitting:  hip flexion with hip abd/add x 10 x 2 sets each leg.    Cryotherapy   Number Minutes Cryotherapy 10 Minutes   Cryotherapy Location Knee  bilat   Type of Cryotherapy Ice pack   Ultrasound   Ultrasound Location Rt medial and lateral knee    Ultrasound Parameters 100%, 3.3 mHz, 1.4 w.cm2 x 9 min  Ultrasound Goals Pain   Manual Therapy   Manual Therapy Taping   Manual therapy comments dynamic tape bilat knees for lateral patellar tracking.                      PT Long Term Goals - 03/29/15 1544    PT LONG TERM GOAL #1   Title I with HEP ( 04/19/15)    Status On-going   PT LONG TERM GOAL #2   Title perform eccentric quad work without difficulty and no knee pain ( 04/19/15)    Status On-going   PT LONG TERM GOAL #3   Title report pain decrease to allow her to sleep per her previous level ( 04/19/15)   Status On-going   PT LONG TERM GOAL #4   Title improve FOTO =/< 41% limited ( 04/19/15)   Status On-going               Plan - 04/03/15 1542    Clinical Impression Statement Pt appears to have had a flare up over weekend, noting increased stiffness and pain.  Pt remarks she has had 40% improvement since beginning therapy. Pt demonstrated improved hip strength bilat. Pt tolerated exercises without increase in pain.  Progressing towards established goals.    Pt will benefit from skilled therapeutic intervention in order to improve on the following deficits Decreased strength;Pain;Difficulty walking   Rehab Potential Good   PT Frequency 2x / week   PT Duration 4 weeks   PT Treatment/Interventions Vasopneumatic Device;Manual techniques;Therapeutic exercise;Moist Heat;Iontophoresis 4mg /ml Dexamethasone;Electrical Stimulation;Cryotherapy;Dry needling;Patient/family education;Ultrasound;Neuromuscular re-education   PT Next Visit Plan Continue progressive strengthening for BLE.     Consulted and Agree with Plan of Care Patient        Problem List Patient Active Problem List   Diagnosis Date Noted  . 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

## 2015-04-06 ENCOUNTER — Ambulatory Visit (INDEPENDENT_AMBULATORY_CARE_PROVIDER_SITE_OTHER): Payer: BLUE CROSS/BLUE SHIELD | Admitting: Physical Therapy

## 2015-04-06 DIAGNOSIS — M25562 Pain in left knee: Secondary | ICD-10-CM

## 2015-04-06 DIAGNOSIS — M25561 Pain in right knee: Secondary | ICD-10-CM | POA: Diagnosis not present

## 2015-04-06 DIAGNOSIS — R293 Abnormal posture: Secondary | ICD-10-CM | POA: Diagnosis not present

## 2015-04-06 DIAGNOSIS — R29898 Other symptoms and signs involving the musculoskeletal system: Secondary | ICD-10-CM | POA: Diagnosis not present

## 2015-04-06 NOTE — Therapy (Addendum)
[place], [place], [address] Phone: [phone]   Fax:  [phone]  Physical Therapy Treatment  Patient Details  Name: Julia Warren MRN: [id number] Date of Birth: [birth year] Referring Provider: Dr. Sydnee Cabal  Encounter Date: 04/06/2015      PT End of Session - 04/06/15 1545    Visit Number 6   Number of Visits 8   Date for PT Re-Evaluation 04/19/15   PT Start Time 1537  pt arrived late   PT Stop Time 1629   PT Time Calculation (min) 52 min      Past Medical History  Diagnosis Date  . Tubal ligation status     Past Surgical History  Procedure Laterality Date  . Tubal ligation  1982  . Appendectomy  1967  . Hysteroscopy  10.10.2012    w/REMOVAL OF MYOMECTOMY  . Knee surgery      There were no vitals filed for this visit.  Visit Diagnosis:  Bilateral knee pain  Weakness of both legs  Posture abnormality      Subjective Assessment - 04/06/15 1546    Currently in Pain? Yes   Pain Score 2    Pain Location Knee   Pain Orientation Right   Pain Descriptors / Indicators Aching            [facility] PT Assessment - 04/06/15 0001    Assessment   Medical Diagnosis Lt & Rt knee MCL sprain   Referring Provider Dr. Sydnee Cabal   Onset Date/Surgical Date 12/26/14   Observation/Other Assessments   Focus on Therapeutic Outcomes (FOTO)  52% limited           [facility] Adult PT Treatment/Exercise - 04/06/15 0001    Knee/Hip Exercises: Stretches   Passive Hamstring Stretch Both;30 seconds;1 rep  HS then hip abd/adduction   Quad Stretch Right;Left;1 rep;30 seconds   Knee/Hip Exercises: Standing   SLS on blue pad with hip flex, abd, ext with opp leg x 15 reps each leg.  forward leans to chair seat and flexed standing knee x 5 each side.  Single leg squat to high table x 1 rep each leg.    Knee/Hip Exercises: Supine   Other Supine Knee/Hip Exercises long sitting:  hip  flexion with hip abd/add x 10 x 2 sets each leg.  Bridging with unilateral knee ext x 10 reps each leg.    Cryotherapy   Number Minutes Cryotherapy 10 Minutes   Cryotherapy Location Knee  bilat   Type of Cryotherapy Ice pack   Ultrasound   Ultrasound Location Rt medial / lateral knee    Ultrasound Parameters 3.3 mHz, 1.1 w/cm2 x 8 min - 4 min to medial @ 100%, 4 min @ lateral 50%.    Ultrasound Goals Pain   Manual Therapy   Manual Therapy Taping   Manual therapy comments dynamic tape bilat knees for lateral patellar tracking.                      PT Long Term Goals - 04/06/15 1648    PT LONG TERM GOAL #1   Title I with HEP ( 04/19/15)    Time 4   Period Weeks   Status Achieved   PT LONG TERM GOAL #2   Title perform eccentric quad work without difficulty and no knee pain ( 04/19/15)    Time 4   Period Weeks   Status Not Met   PT LONG  TERM GOAL #3   Title report pain decrease to allow her to sleep per her previous level ( 04/19/15)   Time 4   Period Weeks   Status Not Met   PT LONG TERM GOAL #4   Title improve FOTO =/< 41% limited ( 04/19/15)   Time 4   Period Weeks   Status Not Met               Plan - 04/06/15 1617    Clinical Impression Statement Pt unable to tolerate single leg squat to high bed without discomfort; stopped after 1 rep each leg.  Pt has had improvement in sleep, but not to baseline yet.  Pt has partially met her goals. She is requesting d/c at this time due to scheduled surgery.    Pt will benefit from skilled therapeutic intervention in order to improve on the following deficits Decreased strength;Pain;Difficulty walking   Rehab Potential Good   PT Frequency 2x / week   PT Duration 4 weeks   PT Treatment/Interventions Vasopneumatic Device;Manual techniques;Therapeutic exercise;Moist Heat;Iontophoresis 81m/ml Dexamethasone;Electrical Stimulation;Cryotherapy;Dry needling;Patient/family education;Ultrasound;Neuromuscular re-education    PT Next Visit Plan Discussed with supervising PT; will d/c at this time.    Consulted and Agree with Plan of Care Patient        Problem List Patient Active Problem List   Diagnosis Date Noted  . 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  Patient goals were partially met. Patient is being discharged due to a change in medical status.  ?????    SJeral Pinch PT 04/27/2015 8:40 AM

## 2015-04-06 NOTE — Patient Instructions (Signed)
Bridge    Lie back, legs bent. Inhale, pressing hips up. Keeping ribs in, lengthen lower back. Exhale, rolling down along spine from top.  When hips are up, extend the knee-- return the foot and lower the hips. Can squeeze ball between knees.  Repeat __10__ times. Do __2__ sets per day.  [email or web address]   ** Forward leans to chair seat.  Stand on one leg and lean forward and touch chair with hand (opposite leg stays in line with trunk).   [facility] [place], [place] [address]  [phone] (office) [phone] (fax)

## 2015-09-07 ENCOUNTER — Ambulatory Visit (INDEPENDENT_AMBULATORY_CARE_PROVIDER_SITE_OTHER): Payer: BLUE CROSS/BLUE SHIELD | Admitting: Urgent Care

## 2015-09-07 VITALS — BP 106/62 | HR 72 | Temp 98.8°F | Resp 16 | Ht 64.75 in | Wt 180.0 lb

## 2015-09-07 DIAGNOSIS — R05 Cough: Secondary | ICD-10-CM

## 2015-09-07 DIAGNOSIS — J029 Acute pharyngitis, unspecified: Secondary | ICD-10-CM | POA: Diagnosis not present

## 2015-09-07 DIAGNOSIS — J069 Acute upper respiratory infection, unspecified: Secondary | ICD-10-CM

## 2015-09-07 DIAGNOSIS — J329 Chronic sinusitis, unspecified: Secondary | ICD-10-CM

## 2015-09-07 DIAGNOSIS — R0982 Postnasal drip: Secondary | ICD-10-CM

## 2015-09-07 DIAGNOSIS — R059 Cough, unspecified: Secondary | ICD-10-CM

## 2015-09-07 MED ORDER — BENZONATATE 100 MG PO CAPS: 100.0000 mg | ORAL_CAPSULE | Freq: Three times a day (TID) | ORAL | Status: DC | PRN

## 2015-09-07 MED ORDER — HYDROCODONE-HOMATROPINE 5-1.5 MG/5ML PO SYRP: 5.0000 mL | ORAL_SOLUTION | Freq: Every evening | ORAL | Status: DC | PRN

## 2015-09-07 MED ORDER — CETIRIZINE HCL 10 MG PO TABS: 10.0000 mg | ORAL_TABLET | Freq: Every day | ORAL | Status: DC

## 2015-09-07 MED ORDER — PSEUDOEPHEDRINE HCL ER 120 MG PO TB12: 120.0000 mg | ORAL_TABLET | Freq: Two times a day (BID) | ORAL | Status: AC

## 2015-09-07 NOTE — Patient Instructions (Signed)
Upper Respiratory Infection, Adult Most upper respiratory infections (URIs) are a viral infection of the air passages leading to the lungs. A URI affects the nose, throat, and upper air passages. The most common type of URI is nasopharyngitis and is typically referred to as "the common cold." URIs run their course and usually go away on their own. Most of the time, a URI does not require medical attention, but sometimes a bacterial infection in the upper airways can follow a viral infection. This is called a secondary infection. Sinus and middle ear infections are common types of secondary upper respiratory infections. Bacterial pneumonia can also complicate a URI. A URI can worsen asthma and chronic obstructive pulmonary disease (COPD). Sometimes, these complications can require emergency medical care and may be life threatening.  CAUSES Almost all URIs are caused by viruses. A virus is a type of germ and can spread from one person to another.  RISKS FACTORS You may be at risk for a URI if:   You smoke.   You have chronic heart or lung disease.  You have a weakened defense (immune) system.   You are very young or very old.   You have nasal allergies or asthma.  You work in crowded or poorly ventilated areas.  You work in health care facilities or schools. SIGNS AND SYMPTOMS  Symptoms typically develop 2-3 days after you come in contact with a cold virus. Most viral URIs last 7-10 days. However, viral URIs from the influenza virus (flu virus) can last 14-18 days and are typically more severe. Symptoms may include:   Runny or stuffy (congested) nose.   Sneezing.   Cough.   Sore throat.   Headache.   Fatigue.   Fever.   Loss of appetite.   Pain in your forehead, behind your eyes, and over your cheekbones (sinus pain).  Muscle aches.  DIAGNOSIS  Your health care provider may diagnose a URI by:  Physical exam.  Tests to check that your symptoms are not due to  another condition such as:  Strep throat.  Sinusitis.  Pneumonia.  Asthma. TREATMENT  A URI goes away on its own with time. It cannot be cured with medicines, but medicines may be prescribed or recommended to relieve symptoms. Medicines may help:  Reduce your fever.  Reduce your cough.  Relieve nasal congestion. HOME CARE INSTRUCTIONS   Take medicines only as directed by your health care provider.   Gargle warm saltwater or take cough drops to comfort your throat as directed by your health care provider.  Use a warm mist humidifier or inhale steam from a shower to increase air moisture. This may make it easier to breathe.  Drink enough fluid to keep your urine clear or pale yellow.   Eat soups and other clear broths and maintain good nutrition.   Rest as needed.   Return to work when your temperature has returned to normal or as your health care provider advises. You may need to stay home longer to avoid infecting others. You can also use a face mask and careful hand washing to prevent spread of the virus.  Increase the usage of your inhaler if you have asthma.   Do not use any tobacco products, including cigarettes, chewing tobacco, or electronic cigarettes. If you need help quitting, ask your health care provider. PREVENTION  The best way to protect yourself from getting a cold is to practice good hygiene.   Avoid oral or hand contact with people with cold   symptoms.   Wash your hands often if contact occurs.  There is no clear evidence that vitamin C, vitamin E, echinacea, or exercise reduces the chance of developing a cold. However, it is always recommended to get plenty of rest, exercise, and practice good nutrition.  SEEK MEDICAL CARE IF:   You are getting worse rather than better.   Your symptoms are not controlled by medicine.   You have chills.  You have worsening shortness of breath.  You have brown or red mucus.  You have yellow or brown nasal  discharge.  You have pain in your face, especially when you bend forward.  You have a fever.  You have swollen neck glands.  You have pain while swallowing.  You have white areas in the back of your throat. SEEK IMMEDIATE MEDICAL CARE IF:   You have severe or persistent:  Headache.  Ear pain.  Sinus pain.  Chest pain.  You have chronic lung disease and any of the following:  Wheezing.  Prolonged cough.  Coughing up blood.  A change in your usual mucus.  You have a stiff neck.  You have changes in your:  Vision.  Hearing.  Thinking.  Mood. MAKE SURE YOU:   Understand these instructions.  Will watch your condition.  Will get help right away if you are not doing well or get worse.   This information is not intended to replace advice given to you by your health care provider. Make sure you discuss any questions you have with your health care provider.   Document Released: 08/07/2000 Document Revised: 06/28/2014 Document Reviewed: 05/19/2013 Elsevier Interactive Patient Education 2016 [employer] Inc.  

## 2015-09-07 NOTE — Progress Notes (Signed)
    MRN: [id number] DOB: Feb 02, 1956  Subjective:   Julia Warren is a 60 y.o. female presenting for chief complaint of Cough  Reports ~1 week history of subjective fever, sore throat, hoarseness (which has improved), worsening productive cough, now having coughing fits, nasal congestion. Cough causes shob, headache, difficulty sleeping. Has tried Mucinex, Delsym, otc medication from [place] while she was vacationing there. No medications have really helped. Denies ear pain, ear drainage, sinus pain, hemoptysis, chest pain, rashes, n/v, abdominal pain. Denies history of seasonal allergies or asthma. Denies smoking cigarettes.  Julia Warren has a current medication list which includes the following prescription(s): ascorbic acid, calcium citrate-vitamin d, desonide, flaxseed (linseed), hydrocortisone-pramoxine, meloxicam, fish oil, valacyclovir, vitamin d (ergocalciferol), vitamin e, and cyanocobalamin. Also is allergic to codeine and penicillins.  Julia Warren  has a past medical history of Tubal ligation status. Also  has past surgical history that includes Tubal ligation (1982); Appendectomy (1967); Hysteroscopy (10.10.2012); and Knee surgery.  Objective:   Vitals: BP 106/62 mmHg  Pulse 72  Temp(Src) 98.8 F (37.1 C) (Oral)  Resp 16  Ht 5' 4.75" (1.645 m)  Wt 180 lb (81.647 kg)  BMI 30.17 kg/m2  SpO2 97%  LMP 08/14/2009  Physical Exam  Constitutional: She is oriented to person, place, and time. She appears well-developed and well-nourished.  HENT:  TM's intact bilaterally but no effusions or erythema. Nasal turbinates slightly erythematous and swollen, nasal passages minimally patent but without sinus tenderness. Postnasal drip present but without oropharyngeal exudates, erythema or abscesses.   Eyes: Right eye exhibits no discharge. Left eye exhibits no discharge. No scleral icterus.  Neck: Normal range of motion. Neck supple.  Cardiovascular: Normal rate, regular rhythm and intact distal  pulses.  Exam reveals no gallop and no friction rub.   No murmur heard. Pulmonary/Chest: No respiratory distress. She has no wheezes. She has no rales.  Lymphadenopathy:    She has cervical adenopathy (right-sided).  Neurological: She is alert and oriented to person, place, and time.  Skin: Skin is warm and dry.   Assessment and Plan :   1. Acute upper respiratory infection 2. Cough 3. Sore throat 4. Post-nasal drainage - I suspect this is viral in nature. Start supportive care, call clinic or rtc on 09/09/2015 if no improvement, consider antibiotic course, further work up. Patient is stop Mucinex, Delsym.  Jaynee Eagles, PA-C [facility] [phone] 09/07/2015 2:46 PM

## 2016-01-24 ENCOUNTER — Ambulatory Visit (INDEPENDENT_AMBULATORY_CARE_PROVIDER_SITE_OTHER): Payer: BLUE CROSS/BLUE SHIELD | Admitting: Emergency Medicine

## 2016-01-24 VITALS — BP 122/72 | HR 62 | Temp 98.5°F | Resp 17 | Ht 64.5 in | Wt 180.0 lb

## 2016-01-24 DIAGNOSIS — F4321 Adjustment disorder with depressed mood: Secondary | ICD-10-CM

## 2016-01-24 DIAGNOSIS — F4320 Adjustment disorder, unspecified: Secondary | ICD-10-CM

## 2016-01-24 DIAGNOSIS — E559 Vitamin D deficiency, unspecified: Secondary | ICD-10-CM | POA: Diagnosis not present

## 2016-01-24 DIAGNOSIS — Z23 Encounter for immunization: Secondary | ICD-10-CM

## 2016-01-24 DIAGNOSIS — Z131 Encounter for screening for diabetes mellitus: Secondary | ICD-10-CM | POA: Diagnosis not present

## 2016-01-24 DIAGNOSIS — Z1159 Encounter for screening for other viral diseases: Secondary | ICD-10-CM | POA: Diagnosis not present

## 2016-01-24 DIAGNOSIS — Z0000 Encounter for general adult medical examination without abnormal findings: Secondary | ICD-10-CM | POA: Diagnosis not present

## 2016-01-24 LAB — POCT URINALYSIS DIP (MANUAL ENTRY)
Bilirubin, UA: NEGATIVE
Blood, UA: NEGATIVE
GLUCOSE UA: NEGATIVE
Ketones, POC UA: NEGATIVE
NITRITE UA: NEGATIVE
Protein Ur, POC: NEGATIVE
Spec Grav, UA: 1.015
UROBILINOGEN UA: 0.2
pH, UA: 5.5

## 2016-01-24 LAB — LIPID PANEL
Cholesterol: 296 mg/dL — ABNORMAL HIGH (ref <200)
HDL: 61 mg/dL (ref >50)
LDL Cholesterol: 171 mg/dL — ABNORMAL HIGH (ref <100)
Total CHOL/HDL Ratio: 4.9 Ratio (ref <5.0)
Triglycerides: 319 mg/dL — ABNORMAL HIGH (ref <150)
VLDL: 64 mg/dL — AB (ref <30)

## 2016-01-24 LAB — CBC WITH DIFFERENTIAL/PLATELET
BASOS PCT: 0 %
Basophils Absolute: 0 cells/uL (ref 0–200)
EOS PCT: 1 %
Eosinophils Absolute: 56 cells/uL (ref 15–500)
HCT: 39.7 % (ref 35.0–45.0)
Hemoglobin: 13.6 g/dL (ref 11.7–15.5)
LYMPHS PCT: 35 %
Lymphs Abs: 1960 cells/uL (ref 850–3900)
MCH: 30.8 pg (ref 27.0–33.0)
MCHC: 34.3 g/dL (ref 32.0–36.0)
MCV: 90 fL (ref 80.0–100.0)
MPV: 10.1 fL (ref 7.5–12.5)
Monocytes Absolute: 392 cells/uL (ref 200–950)
Monocytes Relative: 7 %
NEUTROS PCT: 57 %
Neutro Abs: 3192 cells/uL (ref 1500–7800)
PLATELETS: 251 10*3/uL (ref 140–400)
RBC: 4.41 MIL/uL (ref 3.80–5.10)
RDW: 13.6 % (ref 11.0–15.0)
WBC: 5.6 10*3/uL (ref 3.8–10.8)

## 2016-01-24 LAB — COMPLETE METABOLIC PANEL WITH GFR
ALK PHOS: 67 U/L (ref 33–130)
ALT: 22 U/L (ref 6–29)
AST: 23 U/L (ref 10–35)
Albumin: 4.4 g/dL (ref 3.6–5.1)
BUN: 14 mg/dL (ref 7–25)
CHLORIDE: 104 mmol/L (ref 98–110)
CO2: 25 mmol/L (ref 20–31)
Calcium: 9.8 mg/dL (ref 8.6–10.4)
Creat: 0.9 mg/dL (ref 0.50–0.99)
GFR, EST NON AFRICAN AMERICAN: 69 mL/min (ref >=60)
GFR, Est African American: 80 mL/min (ref >=60)
GLUCOSE: 85 mg/dL (ref 65–99)
POTASSIUM: 4.4 mmol/L (ref 3.5–5.3)
SODIUM: 140 mmol/L (ref 135–146)
Total Bilirubin: 0.4 mg/dL (ref 0.2–1.2)
Total Protein: 6.9 g/dL (ref 6.1–8.1)

## 2016-01-24 LAB — POCT GLYCOSYLATED HEMOGLOBIN (HGB A1C): HEMOGLOBIN A1C: 5.2

## 2016-01-24 NOTE — Progress Notes (Signed)
By signing my name below, I, Julia Warren, attest that this documentation has been prepared under the direction and in the presence of Arlyss Queen, MD. Electronically Signed: Moises Warren, [place]. 01/24/2016 , 12:27 PM .  Patient was seen in room 13 .  Chief Complaint:  Chief Complaint  Patient presents with  . Annual Exam    HPI: Julia Warren is a 60 y.o. female who reports to [facility] today for annual physical, requesting biometrics screening. She had cup of coffee this morning and a half sandwich of grilled cheese at 10:30AM. She denies having any acute concerns today.   Cancer Screening She had mammogram last done in Oct 2016. She plans to schedule having it done this year.  She had colonoscopy done 2 years ago, and was informed to recheck in 10 years.  She sees her OBGYN annually at [facility] with "[name]". She follows up with bloodwork, low vitamin D, and bone density with them.  She denies history of any skin cancers.   Vision She denies seeing eye doctor recently.   Medical history She denies being on chronic medication.  She notes occasional knee pain, but believes this is due to work.   Immunizations Immunization History  Administered Date(s) Administered  . Tdap 03/22/2014   She received flu shot today.   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  She denies history of smoking.  Her family is in [place].   Recent Loss in family Her older brother, who is mentally retarded, passed away recently at age of 6.  Her younger sister (34 years old) had history of thick Warren and passed away recently due to stroke.   Past Medical History:  Diagnosis Date  . Tubal ligation status    Past Surgical History:  Procedure Laterality Date  . APPENDECTOMY  1967  . HYSTEROSCOPY  10.10.2012   w/REMOVAL OF MYOMECTOMY  . KNEE SURGERY    . TUBAL LIGATION  1982   Social History   Social History  . Marital status: Married    Spouse  name: N/A  . Number of children: N/A  . Years of education: N/A   Social History Main Topics  . Smoking status: Never Smoker  . Smokeless tobacco: Never Used  . Alcohol use 0.0 oz/week     Comment: occ  . Drug use: No  . Sexual activity: Yes    Partners: Male    Birth control/ protection: Post-menopausal     Comment: INTEROCOURSE AGE 73, SEXUAL PARTNERS LESS THAN 5   Other Topics Concern  . None   Social History Narrative  . None   Family History  Problem Relation Age of Onset  . Diabetes Mother   . Diabetes Brother   . Colon cancer Maternal Grandmother    Allergies  Allergen Reactions  . Codeine   . Penicillins    Prior to Admission medications   Medication Sig Start Date End Date Taking? Authorizing Provider  Ascorbic Acid (VITAMIN C PO) Take by mouth daily. Reported on 09/07/2015   Yes Historical Provider, MD  benzonatate (TESSALON) 100 MG capsule Take 1-2 capsules (100-200 mg total) by mouth 3 (three) times daily as needed for cough. 09/07/15  Yes Jaynee Eagles, PA-C  Calcium Carbonate-Vitamin D (CALCIUM + D PO) Take by mouth.     Yes Historical Provider, MD  cetirizine (ZYRTEC) 10 MG tablet Take 1 tablet (10 mg total) by mouth daily. 09/07/15  Yes Jaynee Eagles, PA-C  Cyanocobalamin (  VITAMIN B 12 PO) Take by mouth daily. Reported on 09/07/2015   Yes Historical Provider, MD  desonide (DESOWEN) 0.05 % cream use as needed 10/19/14  Yes Huel Cote, NP  Flaxseed, Linseed, (FLAX SEED OIL PO) Take by mouth daily.     Yes Historical Provider, MD  HYDROcodone-homatropine (HYCODAN) 5-1.5 MG/5ML syrup Take 5 mLs by mouth at bedtime as needed. 09/07/15  Yes Jaynee Eagles, PA-C  hydrocortisone-pramoxine (ANALPRAM HC) 2.5-1 % rectal cream Place 1 application rectally 3 (three) times daily. 10/19/14  Yes Huel Cote, NP  meloxicam (MOBIC) 15 MG tablet Take 15 mg by mouth daily.   Yes Historical Provider, MD  Omega-3 Fatty Acids (FISH OIL) 1200 MG CAPS Take by mouth.     Yes Historical  Provider, MD  pseudoephedrine (SUDAFED 12 HOUR) 120 MG 12 hr tablet Take 1 tablet (120 mg total) by mouth 2 (two) times daily. 09/07/15  Yes Jaynee Eagles, PA-C  valACYclovir (VALTREX) 500 MG tablet TAKE 1 TABLET BY MOUTH TWICE A DAY AS NEEDED 10/19/14  Yes Huel Cote, NP  Vitamin D, Ergocalciferol, (DRISDOL) 50000 UNITS CAPS capsule Take 1 capsule (50,000 Units total) by mouth every 7 (seven) days. 10/20/14  Yes Huel Cote, NP  VITAMIN E PO Take by mouth daily.     Yes Historical Provider, MD     ROS:  Constitutional: negative for fever, chills, night sweats, weight changes, or fatigue  HEENT: negative for vision changes, hearing loss, congestion, rhinorrhea, ST, epistaxis, or sinus pressure Cardiovascular: negative for chest pain or palpitations Respiratory: negative for hemoptysis, wheezing, shortness of breath, or cough Abdominal: negative for abdominal pain, nausea, vomiting, diarrhea, or constipation Dermatological: negative for rash Neurologic: negative for headache, dizziness, or syncope All other systems reviewed and are otherwise negative with the exception to those above and in the HPI.  PHYSICAL EXAM: Vitals:   01/24/16 1158  BP: 122/72  Pulse: 62  Resp: 17  Temp: 98.5 F (36.9 C)   Body mass index is 30.42 kg/m.   General: Alert, no acute distress HEENT:  Normocephalic, atraumatic, oropharynx patent. Eye: [name] Cardiovascular:  Regular rate and rhythm, no rubs murmurs or gallops.  No Carotid bruits, radial pulse intact. No pedal edema.  Respiratory: Clear to auscultation bilaterally.  No wheezes, rales, or rhonchi.  No cyanosis, no use of accessory musculature Abdominal: No organomegaly, abdomen is soft and non-tender, positive bowel sounds. No masses. Musculoskeletal: Gait intact. No edema, tenderness Skin: No rashes. Neurologic: Facial musculature symmetric. Psychiatric: Patient acts appropriately throughout our interaction.  Lymphatic: No cervical or  submandibular lymphadenopathy Genitourinary/Anorectal: No acute findings   LABS: Results for orders placed or performed in visit on 01/24/16  POCT urinalysis dipstick  Result Value Ref Range   Color, UA yellow yellow   Clarity, UA clear clear   Glucose, UA negative negative   Bilirubin, UA negative negative   Ketones, POC UA negative negative   Spec Grav, UA 1.015    Warren, UA negative negative   pH, UA 5.5    Protein Ur, POC negative negative   Urobilinogen, UA 0.2    Nitrite, UA Negative Negative   Leukocytes, UA small (1+) (A) Negative  POCT glycosylated hemoglobin (Hb A1C)  Result Value Ref Range   Hemoglobin A1C 5.2     EKG/XRAY:   EKG: Low voltage in the limb leads.   ASSESSMENT/PLAN: The patient has a healthy lifestyle. She was encouraged to exercise more and work on her weight.  She has vitamin D deficiency which is followed by her OB/GYN. She is up-to-date on her colonoscopy. She will schedule her own mammography as well as schedule an appointment to see the ophthalmologist. No medications were prescribed. We'll contact her with her biometrics to help with her insurance rates.I personally performed the services described in this documentation, which was scribed in my presence. The recorded information has been reviewed and is accurate.  Gross sideeffects, risk and benefits, and alternatives of medications d/w patient. Patient is aware that all medications have potential sideeffects and we are unable to predict every sideeffect or drug-drug interaction that may occur.  Arlyss Queen MD 01/24/2016 12:14 PM

## 2016-01-24 NOTE — Progress Notes (Signed)
Yellow Clear n n

## 2016-01-24 NOTE — Patient Instructions (Addendum)
   IF you received an x-ray today, you will receive an invoice from [facility]. Please contact [facility] at [phone] with questions or concerns regarding your invoice.   IF you received labwork today, you will receive an invoice from [facility]. Please contact [facility] at [phone] with questions or concerns regarding your invoice.   Our billing staff will not be able to assist you with questions regarding bills from these companies.  You will be contacted with the lab results as soon as they are available. The fastest way to get your results is to activate your My Chart account. Instructions are located on the last page of this paperwork. If you have not heard from us regarding the results in 2 weeks, please contact this office.     Health Maintenance for Postmenopausal Women Introduction Menopause is a normal process in which your reproductive ability comes to an end. This process happens gradually over a span of months to years, usually between the ages of 48 and 55. Menopause is complete when you have missed 12 consecutive menstrual periods. It is important to talk with your health care provider about some of the most common conditions that affect postmenopausal women, such as heart disease, cancer, and bone loss (osteoporosis). Adopting a healthy lifestyle and getting preventive care can help to promote your health and wellness. Those actions can also lower your chances of developing some of these common conditions. What should I know about menopause? During menopause, you may experience a number of symptoms, such as:  Moderate-to-severe hot flashes.  Night sweats.  Decrease in sex drive.  Mood swings.  Headaches.  Tiredness.  Irritability.  Memory problems.  Insomnia. Choosing to treat or not to treat menopausal changes is an individual decision that you make with your health care provider. What should I know about  hormone replacement therapy and supplements? Hormone therapy products are effective for treating symptoms that are associated with menopause, such as hot flashes and night sweats. Hormone replacement carries certain risks, especially as you become older. If you are thinking about using estrogen or estrogen with progestin treatments, discuss the benefits and risks with your health care provider. What should I know about heart disease and stroke? Heart disease, heart attack, and stroke become more likely as you age. This may be due, in part, to the hormonal changes that your body experiences during menopause. These can affect how your body processes dietary fats, triglycerides, and cholesterol. Heart attack and stroke are both medical emergencies. There are many things that you can do to help prevent heart disease and stroke:  Have your blood pressure checked at least every 1-2 years. High blood pressure causes heart disease and increases the risk of stroke.  If you are 55-79 years old, ask your health care provider if you should take aspirin to prevent a heart attack or a stroke.  Do not use any tobacco products, including cigarettes, chewing tobacco, or electronic cigarettes. If you need help quitting, ask your health care provider.  It is important to eat a healthy diet and maintain a healthy weight.  Be sure to include plenty of vegetables, fruits, low-fat dairy products, and lean protein.  Avoid eating foods that are high in solid fats, added sugars, or salt (sodium).  Get regular exercise. This is one of the most important things that you can do for your health.  Try to exercise for at least 150 minutes each week. The type of exercise that you do   should increase your heart rate and make you sweat. This is known as moderate-intensity exercise.  Try to do strengthening exercises at least twice each week. Do these in addition to the moderate-intensity exercise.  Know your numbers.Ask your  health care provider to check your cholesterol and your blood glucose. Continue to have your blood tested as directed by your health care provider. What should I know about cancer screening? There are several types of cancer. Take the following steps to reduce your risk and to catch any cancer development as early as possible. Breast Cancer  Practice breast self-awareness.  This means understanding how your breasts normally appear and feel.  It also means doing regular breast self-exams. Let your health care provider know about any changes, no matter how small.  If you are 40 or older, have a clinician do a breast exam (clinical breast exam or CBE) every year. Depending on your age, family history, and medical history, it may be recommended that you also have a yearly breast X-ray (mammogram).  If you have a family history of breast cancer, talk with your health care provider about genetic screening.  If you are at high risk for breast cancer, talk with your health care provider about having an MRI and a mammogram every year.  Breast cancer (BRCA) gene test is recommended for women who have family members with BRCA-related cancers. Results of the assessment will determine the need for genetic counseling and BRCA1 and for BRCA2 testing. BRCA-related cancers include these types:  Breast. This occurs in males or females.  Ovarian.  Tubal. This may also be called fallopian tube cancer.  Cancer of the abdominal or pelvic lining (peritoneal cancer).  Prostate.  Pancreatic. Cervical, Uterine, and Ovarian Cancer  Your health care provider may recommend that you be screened regularly for cancer of the pelvic organs. These include your ovaries, uterus, and vagina. This screening involves a pelvic exam, which includes checking for microscopic changes to the surface of your cervix (Pap test).  For women ages 21-65, health care providers may recommend a pelvic exam and a Pap test every three  years. For women ages 30-65, they may recommend the Pap test and pelvic exam, combined with testing for human papilloma virus (HPV), every five years. Some types of HPV increase your risk of cervical cancer. Testing for HPV may also be done on women of any age who have unclear Pap test results.  Other health care providers may not recommend any screening for nonpregnant women who are considered low risk for pelvic cancer and have no symptoms. Ask your health care provider if a screening pelvic exam is right for you.  If you have had past treatment for cervical cancer or a condition that could lead to cancer, you need Pap tests and screening for cancer for at least 20 years after your treatment. If Pap tests have been discontinued for you, your risk factors (such as having a new sexual partner) need to be reassessed to determine if you should start having screenings again. Some women have medical problems that increase the chance of getting cervical cancer. In these cases, your health care provider may recommend that you have screening and Pap tests more often.  If you have a family history of uterine cancer or ovarian cancer, talk with your health care provider about genetic screening.  If you have vaginal bleeding after reaching menopause, tell your health care provider.  There are currently no reliable tests available to screen for ovarian cancer.   Lung Cancer  Lung cancer screening is recommended for adults 55-80 years old who are at high risk for lung cancer because of a history of smoking. A yearly low-dose CT scan of the lungs is recommended if you:  Currently smoke.  Have a history of at least 30 pack-years of smoking and you currently smoke or have quit within the past 15 years. A pack-year is smoking an average of one pack of cigarettes per day for one year. Yearly screening should:  Continue until it has been 15 years since you quit.  Stop if you develop a health problem that would  prevent you from having lung cancer treatment. Colorectal Cancer  This type of cancer can be detected and can often be prevented.  Routine colorectal cancer screening usually begins at age 50 and continues through age 75.  If you have risk factors for colon cancer, your health care provider may recommend that you be screened at an earlier age.  If you have a family history of colorectal cancer, talk with your health care provider about genetic screening.  Your health care provider may also recommend using home test kits to check for hidden blood in your stool.  A small camera at the end of a tube can be used to examine your colon directly (sigmoidoscopy or colonoscopy). This is done to check for the earliest forms of colorectal cancer.  Direct examination of the colon should be repeated every 5-10 years until age 75. However, if early forms of precancerous polyps or small growths are found or if you have a family history or genetic risk for colorectal cancer, you may need to be screened more often. Skin Cancer  Check your skin from head to toe regularly.  Monitor any moles. Be sure to tell your health care provider:  About any new moles or changes in moles, especially if there is a change in a mole's shape or color.  If you have a mole that is larger than the size of a pencil eraser.  If any of your family members has a history of skin cancer, especially at a young age, talk with your health care provider about genetic screening.  Always use sunscreen. Apply sunscreen liberally and repeatedly throughout the day.  Whenever you are outside, protect yourself by wearing long sleeves, pants, a wide-brimmed hat, and sunglasses. What should I know about osteoporosis? Osteoporosis is a condition in which bone destruction happens more quickly than new bone creation. After menopause, you may be at an increased risk for osteoporosis. To help prevent osteoporosis or the bone fractures that can  happen because of osteoporosis, the following is recommended:  If you are 19-50 years old, get at least 1,000 mg of calcium and at least 600 mg of vitamin D per day.  If you are older than age 50 but younger than age 70, get at least 1,200 mg of calcium and at least 600 mg of vitamin D per day.  If you are older than age 70, get at least 1,200 mg of calcium and at least 800 mg of vitamin D per day. Smoking and excessive alcohol intake increase the risk of osteoporosis. Eat foods that are rich in calcium and vitamin D, and do weight-bearing exercises several times each week as directed by your health care provider. What should I know about how menopause affects my mental health? Depression may occur at any age, but it is more common as you become older. Common symptoms of depression include:  Low   Low or sad mood.  Changes in sleep patterns.  Changes in appetite or eating patterns.  Feeling an overall lack of motivation or enjoyment of activities that you previously enjoyed.  Frequent crying spells. Talk with your health care provider if you think that you are experiencing depression. What should I know about immunizations? It is important that you get and maintain your immunizations. These include:  Tetanus, diphtheria, and pertussis (Tdap) booster vaccine.  Influenza every year before the flu season begins.  Pneumonia vaccine.  Shingles vaccine. Your health care provider may also recommend other immunizations. This information is not intended to replace advice given to you by your health care provider. Make sure you discuss any questions you have with your health care provider. Document Released: 04/05/2005 Document Revised: 09/01/2015 Document Reviewed: 11/15/2014  2017 Elsevier  

## 2016-01-25 LAB — HEPATITIS C ANTIBODY: HCV AB: NEGATIVE

## 2016-01-30 ENCOUNTER — Telehealth: Payer: Self-pay

## 2016-01-30 NOTE — Telephone Encounter (Signed)
Patient calling for her lab results from Last Friday.   Dr. Everlene Farrier saw the patient.  [phone]

## 2016-02-01 MED ORDER — ATORVASTATIN CALCIUM 40 MG PO TABS: 40.0000 mg | ORAL_TABLET | Freq: Every day | ORAL | 0 refills | Status: DC

## 2016-02-01 NOTE — Telephone Encounter (Signed)
Pt advised.

## 2016-02-01 NOTE — Telephone Encounter (Signed)
See lab results Pt advised and med sent in f/u 3 month

## 2016-02-12 ENCOUNTER — Telehealth: Payer: Self-pay

## 2016-02-12 NOTE — Telephone Encounter (Signed)
Pt had a CPE with [facility] on 01-24-16 and now needs her work physical form filled out.  First available to complete this please.  She can pick up 02-13-16.  When she comes in she will need a measurement of her waist to complete the form.  [phone].  I have left the form in the nurses box

## 2016-07-10 ENCOUNTER — Encounter: Payer: Self-pay | Admitting: Gynecology

## 2017-01-09 ENCOUNTER — Other Ambulatory Visit: Payer: Self-pay | Admitting: Women's Health

## 2017-01-09 DIAGNOSIS — Z1231 Encounter for screening mammogram for malignant neoplasm of breast: Secondary | ICD-10-CM

## 2017-01-15 ENCOUNTER — Encounter: Payer: Self-pay | Admitting: Family Medicine

## 2017-01-15 ENCOUNTER — Ambulatory Visit: Payer: BLUE CROSS/BLUE SHIELD | Admitting: Family Medicine

## 2017-01-15 VITALS — BP 98/62 | HR 64 | Temp 98.5°F | Resp 12 | Ht 64.0 in | Wt 182.0 lb

## 2017-01-15 DIAGNOSIS — E559 Vitamin D deficiency, unspecified: Secondary | ICD-10-CM | POA: Diagnosis not present

## 2017-01-15 DIAGNOSIS — E782 Mixed hyperlipidemia: Secondary | ICD-10-CM | POA: Diagnosis not present

## 2017-01-15 DIAGNOSIS — R05 Cough: Secondary | ICD-10-CM | POA: Diagnosis not present

## 2017-01-15 DIAGNOSIS — Z131 Encounter for screening for diabetes mellitus: Secondary | ICD-10-CM

## 2017-01-15 DIAGNOSIS — Z23 Encounter for immunization: Secondary | ICD-10-CM | POA: Diagnosis not present

## 2017-01-15 DIAGNOSIS — Z0000 Encounter for general adult medical examination without abnormal findings: Secondary | ICD-10-CM

## 2017-01-15 DIAGNOSIS — M159 Polyosteoarthritis, unspecified: Secondary | ICD-10-CM

## 2017-01-15 DIAGNOSIS — M7661 Achilles tendinitis, right leg: Secondary | ICD-10-CM

## 2017-01-15 DIAGNOSIS — E611 Iron deficiency: Secondary | ICD-10-CM | POA: Diagnosis not present

## 2017-01-15 DIAGNOSIS — E785 Hyperlipidemia, unspecified: Secondary | ICD-10-CM | POA: Insufficient documentation

## 2017-01-15 DIAGNOSIS — R059 Cough, unspecified: Secondary | ICD-10-CM

## 2017-01-15 MED ORDER — ZOSTER VAC RECOMB ADJUVANTED 50 MCG/0.5ML IM SUSR: INTRAMUSCULAR | 1 refills | Status: DC

## 2017-01-15 MED ORDER — OMEPRAZOLE 20 MG PO CPDR: 20.0000 mg | DELAYED_RELEASE_CAPSULE | Freq: Two times a day (BID) | ORAL | 2 refills | Status: DC

## 2017-01-15 NOTE — Progress Notes (Addendum)
HPI:   [name] is a 61 y.o. female, who is here today to establish care.  Former PCP: Daub Last preventive routine visit: 12/2015. She follows with gyn annually and has an appt on 03/03/17 gyn.  Chronic medical problems: OA,HLD,and vit D deficiency among some.   Concerns today: She has multiple complaints today.    Hx of HLD, she has not been on Lipitor 40 mg for 3-4 months. She denies side effects from statin.  Lab Results  Component Value Date   CHOL 296 (H) 01/24/2016   HDL 61 01/24/2016   LDLCALC 171 (H) 01/24/2016   TRIG 319 (H) 01/24/2016   CHOLHDL 4.9 01/24/2016    Fatigue, which has been worse for the past 3 months. She would like lab work done. Denies problems with sleep.  Hx of Vit D def and Iron def, she is not taking vitamin D or iron supplementation. According to pt, iron was checked by her gyn and supplementation was recommended. Not aware of Hx of anemia.  She completed Ergocalciferol 50,000 units about 3 years ago.  OTC Vit makes her dizzy, Rx Ergocalciferol did not.  Cough  Since 04/2016 after a cold. All acute symptoms resolved except the cough. She denies fever, chills, abnormal weight loss, or tobacco use. No sick exposure.  + Exposed to second hand smoking. No associated wt loss, chest pain,wheezing,dyspnea. She feels like she has something in her upper chest "irritating" that makes her cough but she cannot cough up. Denies Hx of allergies.  She has not noted post nasal drainage,heartburn,nausea,or vomiting. She has not identified exacerbating or alleviating factors but has noted that cough is worse in the morning and at night. It is not interfering with sleep. She has tried OTC treatments but have not helped. Stable otherwise.  Right ankle aching pain,intermittently for 2-3 months, she also noted a tender "bump" on posterior aspect. Pain is exacerbated by walking and with certain type of shoe wear. She does not recall  any trauma. No limitation of ROM but some movements also aggravate pain. Not taking OTC medications. Problem is stable.   Joint pain, worse on left thumb for the past 3-4 days. DIP edema. Limitation of ROM. No Hx of trauma. She has been cooking more lately. Right handed. Hx of generalized OA: IP,knees mainly.  After addressing above concerns she tells me she has arranged this appointment for a routine physical, which she needs to have this year in order to get monetary benefit from her health insurance.  She does not exercise regularly but is active due to her job. She follows a healthy diet.  She lives with her husband.  Colonoscopy: 04/06/12. States that 10 years follow up was recommended. Mammogram: 09/2014.She has appt scheduled. DEXA 10/2014 : Osteopenia left hip. She does not take Ca++ with D consistently.   Immunization History  Administered Date(s) Administered  . Influenza,inj,Quad PF,6+ Mos 01/24/2016  . Tdap 03/22/2014   HCV screening: Reports having HCV screening when her brother was Dx with hep C and hers was negative.   Review of Systems  Constitutional: Positive for fatigue. Negative for appetite change, fever and unexpected weight change.  HENT: Negative for congestion, dental problem, hearing loss, mouth sores, sore throat, trouble swallowing and voice change.   Eyes: Negative for redness and visual disturbance.  Respiratory: Positive for cough. Negative for shortness of breath and wheezing.   Cardiovascular: Negative for chest pain and leg swelling.  Gastrointestinal: Negative for abdominal  pain, nausea and vomiting.       No changes in bowel habits.  Endocrine: Negative for cold intolerance, heat intolerance, polydipsia, polyphagia and polyuria.  Genitourinary: Negative for decreased urine volume, dysuria, hematuria, vaginal bleeding and vaginal discharge.  Musculoskeletal: Positive for arthralgias and joint swelling. Negative for back pain and neck pain.    Skin: Negative for color change and rash.  Allergic/Immunologic: Negative for environmental allergies.  Neurological: Negative for syncope, weakness, numbness and headaches.  Hematological: Negative for adenopathy. Does not bruise/bleed easily.  Psychiatric/Behavioral: Negative for confusion and sleep disturbance. The patient is nervous/anxious.   All other systems reviewed and are negative.     Current Outpatient Medications on File Prior to Visit  Medication Sig Dispense Refill  . Ascorbic Acid (VITAMIN C PO) Take by mouth daily. Reported on 09/07/2015    . Calcium Carbonate-Vitamin D (CALCIUM + D PO) Take by mouth.      . cetirizine (ZYRTEC) 10 MG tablet Take 1 tablet (10 mg total) by mouth daily. 30 tablet 11  . Cyanocobalamin (VITAMIN B 12 PO) Take by mouth daily. Reported on 09/07/2015    . desonide (DESOWEN) 0.05 % cream use as needed 60 g 2  . Flaxseed, Linseed, (FLAX SEED OIL PO) Take by mouth daily.      . hydrocortisone-pramoxine (ANALPRAM HC) 2.5-1 % rectal cream Place 1 application rectally 3 (three) times daily. 30 g 3  . meloxicam (MOBIC) 15 MG tablet Take 15 mg by mouth daily.    . Omega-3 Fatty Acids (FISH OIL) 1200 MG CAPS Take by mouth.      . pseudoephedrine (SUDAFED 12 HOUR) 120 MG 12 hr tablet Take 1 tablet (120 mg total) by mouth 2 (two) times daily. 30 tablet 3  . valACYclovir (VALTREX) 500 MG tablet TAKE 1 TABLET BY MOUTH TWICE A DAY AS NEEDED 90 tablet 4  . VITAMIN E PO Take by mouth daily.       No current facility-administered medications on file prior to visit.      Past Medical History:  Diagnosis Date  . Arthritis   . Heart murmur   . Hyperlipidemia   . Tubal ligation status    Allergies  Allergen Reactions  . Codeine   . Penicillins     Family History  Problem Relation Age of Onset  . Diabetes Mother        DM I  . Diabetes Brother   . Mental retardation Brother   . Colon cancer Maternal Grandmother   . Cancer Father        Leukemia   . Depression Paternal Grandmother   . Heart disease Brother        CAD/MI    Social History   Socioeconomic History  . Marital status: Married    Spouse name: None  . Number of children: None  . Years of education: None  . Highest education level: None  Social Needs  . Financial resource strain: None  . Food insecurity - worry: None  . Food insecurity - inability: None  . Transportation needs - medical: None  . Transportation needs - non-medical: None  Occupational History  . None  Tobacco Use  . Smoking status: Never Smoker  . Smokeless tobacco: Never Used  Substance and Sexual Activity  . Alcohol use: Yes    Alcohol/week: 0.0 oz    Comment: occ  . Drug use: No  . Sexual activity: Yes    Partners: Male    Birth  control/protection: Post-menopausal    Comment: INTEROCOURSE AGE 60, SEXUAL PARTNERS LESS THAN 5  Other Topics Concern  . None  Social History Narrative  . None    Vitals:   01/15/17 1413  BP: 98/62  Pulse: 64  Resp: 12  Temp: 98.5 F (36.9 C)  SpO2: 97%    Body mass index is 31.24 kg/m.   Physical Exam  Nursing note and vitals reviewed. Constitutional: She is oriented to person, place, and time. She appears well-developed. No distress.  HENT:  Head: Normocephalic and atraumatic.  Right Ear: Hearing, tympanic membrane, external ear and ear canal normal.  Left Ear: Hearing, tympanic membrane, external ear and ear canal normal.  Mouth/Throat: Uvula is midline, oropharynx is clear and moist and mucous membranes are normal.  Eyes: Conjunctivae and EOM are normal. Pupils are equal, round, and reactive to light.  Neck: No tracheal deviation present. No thyroid mass and no thyromegaly present.  Cardiovascular: Normal rate and regular rhythm.  Murmur (RUSB SEM soft) heard. Pulses:      Dorsalis pedis pulses are 2+ on the right side, and 2+ on the left side.  Respiratory: Effort normal and breath sounds normal. No respiratory distress.  GI: Soft.  She exhibits no mass. There is no hepatomegaly. There is no tenderness.  Genitourinary:  Genitourinary Comments: Deferred to gyn.  Musculoskeletal: She exhibits no edema.       Right ankle: She exhibits normal range of motion. No tenderness. Achilles tendon exhibits pain and defect. Achilles tendon exhibits normal Thompson's test results.       Left hand: She exhibits decreased range of motion and tenderness (Mild upon palpation of DIP nodule).  No signs of synovitis appreciated. Heberden's node and Bouchard's nodes bilateral. Mild limitation of flexion of IP. Left thumb,DIP joint with more pronounce Heberden's nodule.No local heat or edema.  On right achilles tendon local protruded nodular area,2-2.5 cm, no skin changes.    Lymphadenopathy:    She has no cervical adenopathy.       Right: No supraclavicular adenopathy present.       Left: No supraclavicular adenopathy present.  Neurological: She is alert and oriented to person, place, and time. She has normal strength. No cranial nerve deficit. Coordination and gait normal.  Reflex Scores:      Bicep reflexes are 2+ on the right side and 2+ on the left side.      Patellar reflexes are 2+ on the right side and 2+ on the left side. Skin: Skin is warm. No rash noted. No erythema.  Psychiatric: Her mood appears anxious. Cognition and memory are normal.  Well groomed, good eye contact.     ASSESSMENT AND PLAN:   Ms Julia Warren was seen today for establishing care,annual exam, ankle pain and cough among some.  Diagnoses and all orders for this visit:  Routine general medical examination at a health care facility  We discussed the importance of regular physical activity and healthy diet for prevention of chronic illness and/or complications. Preventive guidelines reviewed. Vaccination up dated, Rx for Shingrix given. Keep appt with gyn in 02/2017. Ca++ and vit D supplementation recommended. Next CPE in a year.  Mixed  hyperlipidemia  Continue non pharmacologic treatment. She will be back next week for fasting labs. Will consider resuming Lipitor depending of lab results.  -     Lipid panel; Future  Vitamin D deficiency, unspecified  For now will wait on lab results and will give recommendations accordingly.  -  VITAMIN D 25 Hydroxy (Vit-D Deficiency, Fractures); Future  Iron deficiency  Further recommendations will be given according to lab results.  -     CBC; Future -     IBC panel; Future  Cough  We discussed possible causes: Allergic,GERD,COPD,idiopathic among some. She has tried OTC antihistaminics and has not helped. Recommend trying Omeprazole 20 mg bid for 2-3 months and monitor for changes. Further recommendations will be given according to CXR results. May need pulmonologist evaluation if not better with PPI.  -     DG Chest 2 View; Future  Achilles tendinitis of right lower extremity  Referral to sport medicine placed. Avoid shoe wear that applied pressure on area.  -     Ambulatory referral to Sports Medicine  Diabetes mellitus screening -     Basic metabolic panel; Future  Generalized osteoarthritis of hand  Educated about Dx,prognosis,and treatment options. Explained that this can be aggravated by hand repetitive activities. OTC Tylenol or NSAID's may help.  Need for influenza vaccination -     Flu Vaccine QUAD 36+ mos IM  Other orders -     omeprazole (PRILOSEC) 20 MG capsule; Take 1 capsule (20 mg total) by mouth 2 (two) times daily before a meal. -     Zoster Vaccine Adjuvanted [facility]) injection; 0.5 ml in muscle and repeat in 8 weeks       Julia Warren G. Martinique, MD  [facility] [place] office.

## 2017-01-15 NOTE — Patient Instructions (Signed)
A few things to remember from today's visit:   Routine general medical examination at a health care facility  Mixed hyperlipidemia - Plan: Lipid panel  Vitamin D deficiency, unspecified - Plan: VITAMIN D 25 Hydroxy (Vit-D Deficiency, Fractures)  Iron deficiency - Plan: CBC, IBC panel  Cough - Plan: DG Chest 2 View  Achilles tendinitis of right lower extremity - Plan: Ambulatory referral to Sports Medicine  Diabetes mellitus screening - Plan: Basic metabolic panel  Today you have you routine preventive visit.  At least 150 minutes of moderate exercise per week, daily brisk walking for 15-30 min is a good exercise option. Healthy diet low in saturated (animal) fats and sweets and consisting of fresh fruits and vegetables, lean meats such as fish and white chicken and whole grains.  These are some of recommendations for screening depending of age and risk factors:   - Vaccines:  Tdap vaccine every 10 years.  Shingles vaccine recommended at age 75, could be given after 62 years of age but not sure about insurance coverage.   Pneumonia vaccines:  Prevnar 13 at 65 and Pneumovax at 82. Sometimes Pneumovax is giving earlier if history of smoking, lung disease,diabetes,kidney disease among some.    Screening for diabetes at age 72 and every 3 years.  Cervical cancer prevention:  Pap smear starts at 62 years of age and continues periodically until 62 years old in low risk women. Pap smear every 3 years between 7 and 15 years old. Pap smear every 3-5 years between women 80 and older if pap smear negative and HPV screening negative.   -Breast cancer: Mammogram: There is disagreement between experts about when to start screening in low risk asymptomatic female but recent recommendations are to start screening at 19 and not later than 62 years old , every 1-2 years and after 62 yo q 2 years. Screening is recommended until 62 years old but some women can continue screening depending of  healthy issues.   Colon cancer screening: starts at 62 years old until 62 years old.    Also recommended:  1. Dental visit- Brush and floss your teeth twice daily; visit your dentist twice a year. 2. Eye doctor- Get an eye exam at least every 2 years. 3. Helmet use- Always wear a helmet when riding a bicycle, motorcycle, rollerblading or skateboarding. 4. Safe sex- If you may be exposed to sexually transmitted infections, use a condom. 5. Seat belts- Seat belts can save your live; always wear one. 6. Smoke/Carbon Monoxide detectors- These detectors need to be installed on the appropriate level of your home. Replace batteries at least once a year. 7. Skin cancer- When out in the sun please cover up and use sunscreen 15 SPF or higher. 8. Violence- If anyone is threatening or hurting you, please tell your healthcare provider.  9. Drink alcohol in moderation- Limit alcohol intake to one drink or less per day. Never drink and drive.  Please be sure medication list is accurate. If a new problem present, please set up appointment sooner than planned today.

## 2017-01-16 ENCOUNTER — Encounter: Payer: Self-pay | Admitting: Family Medicine

## 2017-01-21 ENCOUNTER — Other Ambulatory Visit (INDEPENDENT_AMBULATORY_CARE_PROVIDER_SITE_OTHER): Payer: BLUE CROSS/BLUE SHIELD

## 2017-01-21 DIAGNOSIS — Z131 Encounter for screening for diabetes mellitus: Secondary | ICD-10-CM | POA: Diagnosis not present

## 2017-01-21 DIAGNOSIS — E559 Vitamin D deficiency, unspecified: Secondary | ICD-10-CM | POA: Diagnosis not present

## 2017-01-21 DIAGNOSIS — E611 Iron deficiency: Secondary | ICD-10-CM

## 2017-01-21 DIAGNOSIS — E782 Mixed hyperlipidemia: Secondary | ICD-10-CM

## 2017-01-21 LAB — BASIC METABOLIC PANEL
BUN: 15 mg/dL (ref 6–23)
CALCIUM: 9.1 mg/dL (ref 8.4–10.5)
CO2: 26 mEq/L (ref 19–32)
CREATININE: 0.78 mg/dL (ref 0.40–1.20)
Chloride: 107 mEq/L (ref 96–112)
GFR: 79.53 mL/min (ref >60.00)
Glucose, Bld: 99 mg/dL (ref 70–99)
Potassium: 4 mEq/L (ref 3.5–5.1)
SODIUM: 141 meq/L (ref 135–145)

## 2017-01-21 LAB — CBC
HEMATOCRIT: 40.9 % (ref 36.0–46.0)
HEMOGLOBIN: 14 g/dL (ref 12.0–15.0)
MCHC: 34.1 g/dL (ref 30.0–36.0)
MCV: 92.4 fl (ref 78.0–100.0)
Platelets: 222 10*3/uL (ref 150.0–400.0)
RBC: 4.42 Mil/uL (ref 3.87–5.11)
RDW: 12.8 % (ref 11.5–15.5)
WBC: 4.6 10*3/uL (ref 4.0–10.5)

## 2017-01-21 LAB — LDL CHOLESTEROL, DIRECT: LDL DIRECT: 169 mg/dL

## 2017-01-21 LAB — VITAMIN D 25 HYDROXY (VIT D DEFICIENCY, FRACTURES): VITD: 12.77 ng/mL — ABNORMAL LOW (ref 30.00–100.00)

## 2017-01-21 LAB — LIPID PANEL
Cholesterol: 282 mg/dL — ABNORMAL HIGH (ref 0–200)
HDL: 50.8 mg/dL (ref >39.00)
NonHDL: 231.31
Total CHOL/HDL Ratio: 6
Triglycerides: 242 mg/dL — ABNORMAL HIGH (ref 0.0–149.0)
VLDL: 48.4 mg/dL — ABNORMAL HIGH (ref 0.0–40.0)

## 2017-01-21 LAB — IBC PANEL
Iron: 112 ug/dL (ref 42–145)
Saturation Ratios: 39 % (ref 20.0–50.0)
Transferrin: 205 mg/dL — ABNORMAL LOW (ref 212.0–360.0)

## 2017-01-27 ENCOUNTER — Encounter: Payer: Self-pay | Admitting: Family Medicine

## 2017-01-28 ENCOUNTER — Telehealth: Payer: Self-pay | Admitting: Family Medicine

## 2017-01-28 NOTE — Telephone Encounter (Signed)
Results given  Pt states she will restart Lipitor. Pt states she will call to schedule fasting lipid level between May and June 2019. Pt states she will start Vitamin D as directed,   Pt asked about a CXR. States she has been coughing since she was sick back in March. It is worse in the am and at bedtime and it wont go away.     Notes recorded by Martinique, Betty G, MD on 01/27/2017 at 11:51 PM EST Lab work done recently is back:  Normal labs sent through My Chart.  -Cholesterol elevated. Recommend resuming Lipitor , she can try lower dose (20 mg daily) + Low fat diet.  Vit D low. OTC vit D 5000 U daily recommended.  F/U in 4-6 months , fasting.  Keeps appt with gyn in 02/2017.  F/U sooner if new concerns.  Thanks

## 2017-01-29 NOTE — Telephone Encounter (Signed)
Orders were placed for CXR by PCP at last appt. Called patient and notified her she can have CXR completed at [place] office at her convenience. She plans to do this same day as her next sports med appt on 02/14/17 because they are in the same building. No further needs at this time.

## 2017-02-13 ENCOUNTER — Ambulatory Visit
Admission: RE | Admit: 2017-02-13 | Discharge: 2017-02-13 | Disposition: A | Discharge status: Home / Self Care | Payer: BLUE CROSS/BLUE SHIELD | Source: Ambulatory Visit | Attending: Women's Health | Admitting: Women's Health

## 2017-02-13 DIAGNOSIS — Z1231 Encounter for screening mammogram for malignant neoplasm of breast: Secondary | ICD-10-CM

## 2017-02-14 ENCOUNTER — Ambulatory Visit: Payer: Self-pay

## 2017-02-14 ENCOUNTER — Ambulatory Visit (INDEPENDENT_AMBULATORY_CARE_PROVIDER_SITE_OTHER)
Admission: RE | Admit: 2017-02-14 | Discharge: 2017-02-14 | Disposition: A | Discharge status: Home / Self Care | Payer: BLUE CROSS/BLUE SHIELD | Source: Ambulatory Visit | Attending: Family Medicine | Admitting: Family Medicine

## 2017-02-14 ENCOUNTER — Encounter: Payer: Self-pay | Admitting: Family Medicine

## 2017-02-14 ENCOUNTER — Encounter: Payer: Self-pay | Admitting: Women's Health

## 2017-02-14 ENCOUNTER — Ambulatory Visit: Payer: BLUE CROSS/BLUE SHIELD | Admitting: Family Medicine

## 2017-02-14 ENCOUNTER — Other Ambulatory Visit: Payer: Self-pay

## 2017-02-14 VITALS — BP 110/70 | HR 65 | Ht 65.0 in | Wt 183.0 lb

## 2017-02-14 DIAGNOSIS — M7660 Achilles tendinitis, unspecified leg: Secondary | ICD-10-CM

## 2017-02-14 DIAGNOSIS — R059 Cough, unspecified: Secondary | ICD-10-CM

## 2017-02-14 DIAGNOSIS — R05 Cough: Secondary | ICD-10-CM | POA: Diagnosis not present

## 2017-02-14 DIAGNOSIS — M7661 Achilles tendinitis, right leg: Secondary | ICD-10-CM

## 2017-02-14 MED ORDER — DICLOFENAC SODIUM 2 % TD SOLN: 2.0000 g | Freq: Two times a day (BID) | TRANSDERMAL | 3 refills | Status: AC

## 2017-02-14 MED ORDER — NITROGLYCERIN 0.2 MG/HR TD PT24: MEDICATED_PATCH | TRANSDERMAL | 1 refills | Status: DC

## 2017-02-14 NOTE — Progress Notes (Signed)
[facility] [address], [place] [address] Phone: [phone] Subjective:     CC: Right ankle pain  [id number]  Julia Warren is a 61 y.o. female coming in with complaint of right achilles pain. She states she has a lump on her achilles. Says she got some new [employer] shoes that irritated her achilles.   Onset- 3-4 months ago Location- Achilles Duration-  Character- Achy  Aggravating factors- Sitting to getting up, going down steps Reliving factors-  Therapies tried-  Severity-6 out of 10     Past Medical History:  Diagnosis Date  . Arthritis   . Heart murmur   . Hyperlipidemia   . Tubal ligation status    Past Surgical History:  Procedure Laterality Date  . APPENDECTOMY  1967  . HYSTEROSCOPY  10.10.2012   w/REMOVAL OF MYOMECTOMY  . KNEE SURGERY    . TUBAL LIGATION  1982   Social History   Socioeconomic History  . Marital status: Married    Spouse name: None  . Number of children: None  . Years of education: None  . Highest education level: None  Social Needs  . Financial resource strain: None  . Food insecurity - worry: None  . Food insecurity - inability: None  . Transportation needs - medical: None  . Transportation needs - non-medical: None  Occupational History  . None  Tobacco Use  . Smoking status: Never Smoker  . Smokeless tobacco: Never Used  Substance and Sexual Activity  . Alcohol use: Yes    Alcohol/week: 0.0 oz    Comment: occ  . Drug use: No  . Sexual activity: Yes    Partners: Male    Birth control/protection: Post-menopausal    Comment: INTEROCOURSE AGE 61, SEXUAL PARTNERS LESS THAN 5  Other Topics Concern  . None  Social History Narrative  . None   Allergies  Allergen Reactions  . Codeine   . Penicillins    Family History  Problem Relation Age of Onset  . Diabetes Mother        DM I  . Diabetes Brother   . Mental retardation Brother   . Colon cancer Maternal Grandmother   .  Cancer Father        Leukemia  . Depression Paternal Grandmother   . Heart disease Brother        CAD/MI     Past medical history, social, surgical and family history all reviewed in electronic medical record.  No pertanent information unless stated regarding to the chief complaint.   Review of Systems:Review of systems updated and as accurate as of 02/14/17  No headache, visual changes, nausea, vomiting, diarrhea, constipation, dizziness, abdominal pain, skin rash, fevers, chills, night sweats, weight loss, swollen lymph nodes, body aches, joint swelling, muscle aches, chest pain, shortness of breath, mood changes.   Objective  Blood pressure 110/70, pulse 65, height 5\' 5"  (1.651 m), weight 183 lb (83 kg), last menstrual period 10/03/2010, SpO2 97 %. Systems examined below as of 02/14/17   General: No apparent distress alert and oriented x3 mood and affect normal, dressed appropriately.  HEENT: Pupils equal, extraocular movements intact  Respiratory: Patient's speak in full sentences and does not appear short of breath  Cardiovascular: No lower extremity edema, non tender, no erythema  Skin: Warm dry intact with no signs of infection or rash on extremities or on axial skeleton.  Abdomen: Soft nontender  Neuro: Cranial nerves II through XII are intact,  neurovascularly intact in all extremities with 2+ DTRs and 2+ pulses.  Lymph: No lymphadenopathy of posterior or anterior cervical chain or axillae bilaterally.  Gait normal with good balance and coordination.  MSK:  Non tender with full range of motion and good stability and symmetric strength and tone of shoulders, elbows, wrist, hip, knees bilaterally.  Ankle: Right No visible erythema or swelling. Range of motion is full in all directions. Strength is 5/5 in all directions. Stable lateral and medial ligaments; squeeze test and kleiger test unremarkable; Talar dome nontender; No pain at base of 5th MT; No tenderness over cuboid; No  tenderness over N spot or navicular prominence Tenderness at the insertion of the Achilles.  Mild nodule noted in this area. No sign of peroneal tendon subluxations or tenderness to palpation Negative tarsal tunnel tinel's Able to walk 4 steps. Contralateral ankle unremarkable  MSK US performed of: Right ankle This study was ordered, performed, and interpreted by Charlann Boxer D.O.  Foot/Ankle:   All structures visualized.   Talar dome unremarkable  Ankle mortise without effusion. Peroneus longus and brevis tendons unremarkable on long and transverse views without sheath effusions. Posterior tibialis, flexor hallucis longus, and flexor digitorum longus tendons unremarkable on long and transverse views without sheath effusions. Achilles tendon shows patient does have hypoechoic changes.  Patient on approximately 2 cm proximal to the insertion does have increasing vascularity and intrasubstance tearing. Anterior Talofibular Ligament and Calcaneofibular Ligaments unremarkable and intact. Deltoid Ligament unremarkable and intact. Plantar fascia intact and without effusion, normal thickness. No increased doppler signal, cap sign, or thickening of tibial cortex. Power doppler signal normal.  IMPRESSION: Severe Achilles tendinitis with intrasubstance tearing and increased neovascularization  [id number]; 15 additional minutes spent for Therapeutic exercises as stated in above notes.  This included exercises focusing on stretching, strengthening, with significant focus on eccentric aspects.   Long term goals include an improvement in range of motion, strength, endurance as well as avoiding reinjury. Patient's frequency would include in 1-2 times a day, 3-5 times a week for a duration of 6-12 weeks.  Ankle strengthening that included:  Basic range of motion exercises to allow proper full motion at ankle Stretching of the lower leg and hamstrings  Theraband exercises for the lower leg - inversion, eversion,  dorsiflexion and plantarflexion each to be completed with a theraband Balance exercises to increase proprioception Weight bearing exercises to increase strength and balance Proper technique shown and discussed handout in great detail with ATC.  All questions were discussed and answered.       Impression and Recommendations:     This case required medical decision making of moderate complexity.      Note: This dictation was prepared with Dragon dictation along with smaller phrase technology. Any transcriptional errors that result from this process are unintentional.

## 2017-02-14 NOTE — Patient Instructions (Signed)
Good to see you.  Ice 20 minutes 2 times daily. Usually after activity and before bed. Wear brace daily for next 2 weeks.  pennsaid pinkie amount topically 2 times daily as needed.  Heel lift or [employer] orthotics "total support" online would be great in your shoe Do not lace last eye on your shoe.  Avoid being barefoot in the house  Nitroglycerin Protocol   Apply 1/4 nitroglycerin patch to affected area daily.  Change position of patch within the affected area every 24 hours.  You may experience a headache during the first 1-2 weeks of using the patch, these should subside.  If you experience headaches after beginning nitroglycerin patch treatment, you may take your preferred over the counter pain reliever.  Another side effect of the nitroglycerin patch is skin irritation or rash related to patch adhesive.  Please notify our office if you develop more severe headaches or rash, and stop the patch.  Tendon healing with nitroglycerin patch may require 12 to 24 weeks depending on the extent of injury.  Men should not use if taking Viagra, Cialis, or Levitra.   Do not use if you have migraines or rosacea.  See me again in 4 weeks  Happy holidays!

## 2017-02-14 NOTE — Assessment & Plan Note (Signed)
Patient does have Achilles tendinitis with some intrasubstance tearing.  Topical anti-inflammatories, home exercise given as well as bracing.  Follow-up again in 3-4 weeks worsening symptoms we need physical therapy.  If improvement will advance accordingly.

## 2017-02-17 ENCOUNTER — Encounter: Payer: Self-pay | Admitting: Family Medicine

## 2017-02-26 ENCOUNTER — Telehealth: Payer: Self-pay | Admitting: *Deleted

## 2017-02-26 NOTE — Telephone Encounter (Signed)
Copied from [place]. Topic: Inquiry >> Feb 21, 2017  8:40 AM Ether Griffins B wrote: Reason for CRM: pt wanting to know if heel lift has come in yet. She was fitted for one on 02/14/17. Please call cell [phone].

## 2017-03-03 ENCOUNTER — Ambulatory Visit (INDEPENDENT_AMBULATORY_CARE_PROVIDER_SITE_OTHER): Payer: BLUE CROSS/BLUE SHIELD | Admitting: Women's Health

## 2017-03-03 ENCOUNTER — Encounter: Payer: Self-pay | Admitting: Women's Health

## 2017-03-03 VITALS — BP 118/80 | Ht 65.0 in | Wt 185.0 lb

## 2017-03-03 DIAGNOSIS — Z01419 Encounter for gynecological examination (general) (routine) without abnormal findings: Secondary | ICD-10-CM | POA: Diagnosis not present

## 2017-03-03 DIAGNOSIS — B009 Herpesviral infection, unspecified: Secondary | ICD-10-CM | POA: Diagnosis not present

## 2017-03-03 DIAGNOSIS — Z13820 Encounter for screening for osteoporosis: Secondary | ICD-10-CM | POA: Diagnosis not present

## 2017-03-03 MED ORDER — DESONIDE 0.05 % EX CREA: TOPICAL_CREAM | CUTANEOUS | 2 refills | Status: AC

## 2017-03-03 MED ORDER — VALACYCLOVIR HCL 500 MG PO TABS: ORAL_TABLET | ORAL | 4 refills | Status: AC

## 2017-03-03 NOTE — Progress Notes (Signed)
Julia Warren 05-Nov-1955 [id number]    History:    Presents for annual exam.  Postmenopausal on no HRT with no bleeding. History of HSV 2 rare outbreaks. 2016 T score -1.5 without elevated fracture risk. Normal Pap and mammogram history. 2014 negative colonoscopy.  Past medical history, past surgical history, family history and social history were all reviewed and documented in the EPIC chart. 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 Sister died at age 63 from a stroke this past year, brother who had birth defects, wheelchair died at age 51. Mother, brother diabetes. Mother deceased from diabetes at age 65. 49 granddaughters, strained relationship with adopted son and his family.  ROS:  A ROS was performed and pertinent positives and negatives are included.  Exam:  Vitals:   03/03/17 1611  BP: 118/80  Weight: 185 lb (83.9 kg)  Height: 5\' 5"  (1.651 m)   Body mass index is 30.79 kg/m.   General appearance:  Normal Thyroid:  Symmetrical, normal in size, without palpable masses or nodularity. Respiratory  Auscultation:  Clear without wheezing or rhonchi Cardiovascular  Auscultation:  Regular rate, without rubs, murmurs or gallops  Edema/varicosities:  Not grossly evident Abdominal  Soft,nontender, without masses, guarding or rebound.  Liver/spleen:  No organomegaly noted  Hernia:  None appreciated  Skin  Inspection:  Grossly normal   Breasts: Examined lying and sitting.     Right: Without masses, retractions, discharge or axillary adenopathy.     Left: Without masses, retractions, discharge or axillary adenopathy. Gentitourinary   Inguinal/mons:  Normal without inguinal adenopathy  External genitalia:  Normal  BUS/Urethra/Skene's glands:  Normal  Vagina:  Normal  Cervix:  Normal  Uterus:   normal in size, shape and contour.  Midline and mobile  Adnexa/parametria:     Rt: Without masses or tenderness.   Lt: Without masses or tenderness.  Anus and perineum: Normal  Digital  rectal exam: Normal sphincter tone without palpated masses or tenderness  Assessment/Plan:  62 y.o. MWF G4 P2 +1 adopted son  for annual exam with no complaints.  Postmenopausal/no HRT/no bleeding Osteopenia without elevated FRAX HSV 2 rare outbreaks Arthritis, meds and Labs-primary care Psoriasis  Plan: Requested refill of  [place]owen  0.05 cream uses minimal amount for psoriasis, follow-up with dermatologist as needed. Rx given. Valtrex 500 twice daily for 3-5 days as needed prescription, proper use given and reviewed. SBE's, continue annual screening mammogram, calcium rich diet, vitamin D supplement per primary care. Home safety, fall prevention and importance of weightbearing and balance type exercise reviewed. Repeat DEXA will schedule. Pap normal 2016, new screening guidelines reviewed.   Eden Roc, 5:09 PM 03/03/2017

## 2017-03-03 NOTE — Patient Instructions (Addendum)
Health Maintenance for Postmenopausal Women Menopause is a normal process in which your reproductive ability comes to an end. This process happens gradually over a span of months to years, usually between the ages of 22 and 9. Menopause is complete when you have missed 12 consecutive menstrual periods. It is important to talk with your health care provider about some of the most common conditions that affect postmenopausal women, such as heart disease, cancer, and bone loss (osteoporosis). Adopting a healthy lifestyle and getting preventive care can help to promote your health and wellness. Those actions can also lower your chances of developing some of these common conditions. What should I know about menopause? During menopause, you may experience a number of symptoms, such as:  Moderate-to-severe hot flashes.  Night sweats.  Decrease in sex drive.  Mood swings.  Headaches.  Tiredness.  Irritability.  Memory problems.  Insomnia.  Choosing to treat or not to treat menopausal changes is an individual decision that you make with your health care provider. What should I know about hormone replacement therapy and supplements? Hormone therapy products are effective for treating symptoms that are associated with menopause, such as hot flashes and night sweats. Hormone replacement carries certain risks, especially as you become older. If you are thinking about using estrogen or estrogen with progestin treatments, discuss the benefits and risks with your health care provider. What should I know about heart disease and stroke? Heart disease, heart attack, and stroke become more likely as you age. This may be due, in part, to the hormonal changes that your body experiences during menopause. These can affect how your body processes dietary fats, triglycerides, and cholesterol. Heart attack and stroke are both medical emergencies. There are many things that you can do to help prevent heart disease  and stroke:  Have your blood pressure checked at least every 1-2 years. High blood pressure causes heart disease and increases the risk of stroke.  If you are 53-22 years old, ask your health care provider if you should take aspirin to prevent a heart attack or a stroke.  Do not use any tobacco products, including cigarettes, chewing tobacco, or electronic cigarettes. If you need help quitting, ask your health care provider.  It is important to eat a healthy diet and maintain a healthy weight. ? Be sure to include plenty of vegetables, fruits, low-fat dairy products, and lean protein. ? Avoid eating foods that are high in solid fats, added sugars, or salt (sodium).  Get regular exercise. This is one of the most important things that you can do for your health. ? Try to exercise for at least 150 minutes each week. The type of exercise that you do should increase your heart rate and make you sweat. This is known as moderate-intensity exercise. ? Try to do strengthening exercises at least twice each week. Do these in addition to the moderate-intensity exercise.  Know your numbers.Ask your health care provider to check your cholesterol and your blood glucose. Continue to have your blood tested as directed by your health care provider.  What should I know about cancer screening? There are several types of cancer. Take the following steps to reduce your risk and to catch any cancer development as early as possible. Breast Cancer  Practice breast self-awareness. ? This means understanding how your breasts normally appear and feel. ? It also means doing regular breast self-exams. Let your health care provider know about any changes, no matter how small.  If you are 40  or older, have a clinician do a breast exam (clinical breast exam or CBE) every year. Depending on your age, family history, and medical history, it may be recommended that you also have a yearly breast X-ray (mammogram).  If you  have a family history of breast cancer, talk with your health care provider about genetic screening.  If you are at high risk for breast cancer, talk with your health care provider about having an MRI and a mammogram every year.  Breast cancer (BRCA) gene test is recommended for women who have family members with BRCA-related cancers. Results of the assessment will determine the need for genetic counseling and BRCA1 and for BRCA2 testing. BRCA-related cancers include these types: ? Breast. This occurs in males or females. ? Ovarian. ? Tubal. This may also be called fallopian tube cancer. ? Cancer of the abdominal or pelvic lining (peritoneal cancer). ? Prostate. ? Pancreatic.  Cervical, Uterine, and Ovarian Cancer Your health care provider may recommend that you be screened regularly for cancer of the pelvic organs. These include your ovaries, uterus, and vagina. This screening involves a pelvic exam, which includes checking for microscopic changes to the surface of your cervix (Pap test).  For women ages 21-65, health care providers may recommend a pelvic exam and a Pap test every three years. For women ages 79-65, they may recommend the Pap test and pelvic exam, combined with testing for human papilloma virus (HPV), every five years. Some types of HPV increase your risk of cervical cancer. Testing for HPV may also be done on women of any age who have unclear Pap test results.  Other health care providers may not recommend any screening for nonpregnant women who are considered low risk for pelvic cancer and have no symptoms. Ask your health care provider if a screening pelvic exam is right for you.  If you have had past treatment for cervical cancer or a condition that could lead to cancer, you need Pap tests and screening for cancer for at least 20 years after your treatment. If Pap tests have been discontinued for you, your risk factors (such as having a new sexual partner) need to be  reassessed to determine if you should start having screenings again. Some women have medical problems that increase the chance of getting cervical cancer. In these cases, your health care provider may recommend that you have screening and Pap tests more often.  If you have a family history of uterine cancer or ovarian cancer, talk with your health care provider about genetic screening.  If you have vaginal bleeding after reaching menopause, tell your health care provider.  There are currently no reliable tests available to screen for ovarian cancer.  Lung Cancer Lung cancer screening is recommended for adults 69-62 years old who are at high risk for lung cancer because of a history of smoking. A yearly low-dose CT scan of the lungs is recommended if you:  Currently smoke.  Have a history of at least 30 pack-years of smoking and you currently smoke or have quit within the past 15 years. A pack-year is smoking an average of one pack of cigarettes per day for one year.  Yearly screening should:  Continue until it has been 15 years since you quit.  Stop if you develop a health problem that would prevent you from having lung cancer treatment.  Colorectal Cancer  This type of cancer can be detected and can often be prevented.  Routine colorectal cancer screening usually begins at  age 42 and continues through age 45.  If you have risk factors for colon cancer, your health care provider may recommend that you be screened at an earlier age.  If you have a family history of colorectal cancer, talk with your health care provider about genetic screening.  Your health care provider may also recommend using home test kits to check for hidden blood in your stool.  A small camera at the end of a tube can be used to examine your colon directly (sigmoidoscopy or colonoscopy). This is done to check for the earliest forms of colorectal cancer.  Direct examination of the colon should be repeated every  5-10 years until age 71. However, if early forms of precancerous polyps or small growths are found or if you have a family history or genetic risk for colorectal cancer, you may need to be screened more often.  Skin Cancer  Check your skin from head to toe regularly.  Monitor any moles. Be sure to tell your health care provider: ? About any new moles or changes in moles, especially if there is a change in a mole's shape or color. ? If you have a mole that is larger than the size of a pencil eraser.  If any of your family members has a history of skin cancer, especially at a young age, talk with your health care provider about genetic screening.  Always use sunscreen. Apply sunscreen liberally and repeatedly throughout the day.  Whenever you are outside, protect yourself by wearing long sleeves, pants, a wide-brimmed hat, and sunglasses.  What should I know about osteoporosis? Osteoporosis is a condition in which bone destruction happens more quickly than new bone creation. After menopause, you may be at an increased risk for osteoporosis. To help prevent osteoporosis or the bone fractures that can happen because of osteoporosis, the following is recommended:  If you are 46-71 years old, get at least 1,000 mg of calcium and at least 600 mg of vitamin D per day.  If you are older than age 55 but younger than age 65, get at least 1,200 mg of calcium and at least 600 mg of vitamin D per day.  If you are older than age 54, get at least 1,200 mg of calcium and at least 800 mg of vitamin D per day.  Smoking and excessive alcohol intake increase the risk of osteoporosis. Eat foods that are rich in calcium and vitamin D, and do weight-bearing exercises several times each week as directed by your health care provider. What should I know about how menopause affects my mental health? Depression may occur at any age, but it is more common as you become older. Common symptoms of depression  include:  Low or sad mood.  Changes in sleep patterns.  Changes in appetite or eating patterns.  Feeling an overall lack of motivation or enjoyment of activities that you previously enjoyed.  Frequent crying spells.  Talk with your health care provider if you think that you are experiencing depression. What should I know about immunizations? It is important that you get and maintain your immunizations. These include:  Tetanus, diphtheria, and pertussis (Tdap) booster vaccine.  Influenza every year before the flu season begins.  Pneumonia vaccine.  Shingles vaccine.  Your health care provider may also recommend other immunizations. This information is not intended to replace advice given to you by your health care provider. Make sure you discuss any questions you have with your health care provider. Document Released: 04/05/2005  Document Revised: 09/01/2015 Document Reviewed: 11/15/2014 Elsevier Interactive Patient Education  2018 [employer]  Health Maintenance for Postmenopausal Women Menopause is a normal process in which your reproductive ability comes to an end. This process happens gradually over a span of months to years, usually between the ages of 48 and 55. Menopause is complete when you have missed 12 consecutive menstrual periods. It is important to talk with your health care provider about some of the most common conditions that affect postmenopausal women, such as heart disease, cancer, and bone loss (osteoporosis). Adopting a healthy lifestyle and getting preventive care can help to promote your health and wellness. Those actions can also lower your chances of developing some of these common conditions. What should I know about menopause? During menopause, you may experience a number of symptoms, such as:  Moderate-to-severe hot flashes.  Night sweats.  Decrease in sex drive.  Mood swings.  Headaches.  Tiredness.  Irritability.  Memory  problems.  Insomnia.  Choosing to treat or not to treat menopausal changes is an individual decision that you make with your health care provider. What should I know about hormone replacement therapy and supplements? Hormone therapy products are effective for treating symptoms that are associated with menopause, such as hot flashes and night sweats. Hormone replacement carries certain risks, especially as you become older. If you are thinking about using estrogen or estrogen with progestin treatments, discuss the benefits and risks with your health care provider. What should I know about heart disease and stroke? Heart disease, heart attack, and stroke become more likely as you age. This may be due, in part, to the hormonal changes that your body experiences during menopause. These can affect how your body processes dietary fats, triglycerides, and cholesterol. Heart attack and stroke are both medical emergencies. There are many things that you can do to help prevent heart disease and stroke:  Have your blood pressure checked at least every 1-2 years. High blood pressure causes heart disease and increases the risk of stroke.  If you are 55-79 years old, ask your health care provider if you should take aspirin to prevent a heart attack or a stroke.  Do not use any tobacco products, including cigarettes, chewing tobacco, or electronic cigarettes. If you need help quitting, ask your health care provider.  It is important to eat a healthy diet and maintain a healthy weight. ? Be sure to include plenty of vegetables, fruits, low-fat dairy products, and lean protein. ? Avoid eating foods that are high in solid fats, added sugars, or salt (sodium).  Get regular exercise. This is one of the most important things that you can do for your health. ? Try to exercise for at least 150 minutes each week. The type of exercise that you do should increase your heart rate and make you sweat. This is known as  moderate-intensity exercise. ? Try to do strengthening exercises at least twice each week. Do these in addition to the moderate-intensity exercise.  Know your numbers.Ask your health care provider to check your cholesterol and your blood glucose. Continue to have your blood tested as directed by your health care provider.  What should I know about cancer screening? There are several types of cancer. Take the following steps to reduce your risk and to catch any cancer development as early as possible. Breast Cancer  Practice breast self-awareness. ? This means understanding how your breasts normally appear and feel. ? It also means doing regular breast self-exams. Let your   health care provider know about any changes, no matter how small.  If you are 40 or older, have a clinician do a breast exam (clinical breast exam or CBE) every year. Depending on your age, family history, and medical history, it may be recommended that you also have a yearly breast X-ray (mammogram).  If you have a family history of breast cancer, talk with your health care provider about genetic screening.  If you are at high risk for breast cancer, talk with your health care provider about having an MRI and a mammogram every year.  Breast cancer (BRCA) gene test is recommended for women who have family members with BRCA-related cancers. Results of the assessment will determine the need for genetic counseling and BRCA1 and for BRCA2 testing. BRCA-related cancers include these types: ? Breast. This occurs in males or females. ? Ovarian. ? Tubal. This may also be called fallopian tube cancer. ? Cancer of the abdominal or pelvic lining (peritoneal cancer). ? Prostate. ? Pancreatic.  Cervical, Uterine, and Ovarian Cancer Your health care provider may recommend that you be screened regularly for cancer of the pelvic organs. These include your ovaries, uterus, and vagina. This screening involves a pelvic exam, which  includes checking for microscopic changes to the surface of your cervix (Pap test).  For women ages 21-65, health care providers may recommend a pelvic exam and a Pap test every three years. For women ages 30-65, they may recommend the Pap test and pelvic exam, combined with testing for human papilloma virus (HPV), every five years. Some types of HPV increase your risk of cervical cancer. Testing for HPV may also be done on women of any age who have unclear Pap test results.  Other health care providers may not recommend any screening for nonpregnant women who are considered low risk for pelvic cancer and have no symptoms. Ask your health care provider if a screening pelvic exam is right for you.  If you have had past treatment for cervical cancer or a condition that could lead to cancer, you need Pap tests and screening for cancer for at least 20 years after your treatment. If Pap tests have been discontinued for you, your risk factors (such as having a new sexual partner) need to be reassessed to determine if you should start having screenings again. Some women have medical problems that increase the chance of getting cervical cancer. In these cases, your health care provider may recommend that you have screening and Pap tests more often.  If you have a family history of uterine cancer or ovarian cancer, talk with your health care provider about genetic screening.  If you have vaginal bleeding after reaching menopause, tell your health care provider.  There are currently no reliable tests available to screen for ovarian cancer.  Lung Cancer Lung cancer screening is recommended for adults 55-80 years old who are at high risk for lung cancer because of a history of smoking. A yearly low-dose CT scan of the lungs is recommended if you:  Currently smoke.  Have a history of at least 30 pack-years of smoking and you currently smoke or have quit within the past 15 years. A pack-year is smoking an  average of one pack of cigarettes per day for one year.  Yearly screening should:  Continue until it has been 15 years since you quit.  Stop if you develop a health problem that would prevent you from having lung cancer treatment.  Colorectal Cancer  This type of cancer   can be detected and can often be prevented.  Routine colorectal cancer screening usually begins at age 93 and continues through age 26.  If you have risk factors for colon cancer, your health care provider may recommend that you be screened at an earlier age.  If you have a family history of colorectal cancer, talk with your health care provider about genetic screening.  Your health care provider may also recommend using home test kits to check for hidden blood in your stool.  A small camera at the end of a tube can be used to examine your colon directly (sigmoidoscopy or colonoscopy). This is done to check for the earliest forms of colorectal cancer.  Direct examination of the colon should be repeated every 5-10 years until age 38. However, if early forms of precancerous polyps or small growths are found or if you have a family history or genetic risk for colorectal cancer, you may need to be screened more often.  Skin Cancer  Check your skin from head to toe regularly.  Monitor any moles. Be sure to tell your health care provider: ? About any new moles or changes in moles, especially if there is a change in a mole's shape or color. ? If you have a mole that is larger than the size of a pencil eraser.  If any of your family members has a history of skin cancer, especially at a young age, talk with your health care provider about genetic screening.  Always use sunscreen. Apply sunscreen liberally and repeatedly throughout the day.  Whenever you are outside, protect yourself by wearing long sleeves, pants, a wide-brimmed hat, and sunglasses.  What should I know about osteoporosis? Osteoporosis is a condition in  which bone destruction happens more quickly than new bone creation. After menopause, you may be at an increased risk for osteoporosis. To help prevent osteoporosis or the bone fractures that can happen because of osteoporosis, the following is recommended:  If you are 11-31 years old, get at least 1,000 mg of calcium and at least 600 mg of vitamin D per day.  If you are older than age 66 but younger than age 45, get at least 1,200 mg of calcium and at least 600 mg of vitamin D per day.  If you are older than age 10, get at least 1,200 mg of calcium and at least 800 mg of vitamin D per day.  Smoking and excessive alcohol intake increase the risk of osteoporosis. Eat foods that are rich in calcium and vitamin D, and do weight-bearing exercises several times each week as directed by your health care provider. What should I know about how menopause affects my mental health? Depression may occur at any age, but it is more common as you become older. Common symptoms of depression include:  Low or sad mood.  Changes in sleep patterns.  Changes in appetite or eating patterns.  Feeling an overall lack of motivation or enjoyment of activities that you previously enjoyed.  Frequent crying spells.  Talk with your health care provider if you think that you are experiencing depression. What should I know about immunizations? It is important that you get and maintain your immunizations. These include:  Tetanus, diphtheria, and pertussis (Tdap) booster vaccine.  Influenza every year before the flu season begins.  Pneumonia vaccine.  Shingles vaccine.  Your health care provider may also recommend other immunizations. This information is not intended to replace advice given to you by your health care provider. Make  sure you discuss any questions you have with your health care provider. Document Released: 04/05/2005 Document Revised: 09/01/2015 Document Reviewed: 11/15/2014 Elsevier Interactive  Patient Education  2018 [employer].

## 2017-07-23 ENCOUNTER — Other Ambulatory Visit: Payer: Self-pay | Admitting: Family Medicine

## 2018-02-16 ENCOUNTER — Ambulatory Visit (INDEPENDENT_AMBULATORY_CARE_PROVIDER_SITE_OTHER): Payer: BLUE CROSS/BLUE SHIELD | Admitting: Family Medicine

## 2018-02-16 ENCOUNTER — Encounter: Payer: Self-pay | Admitting: Family Medicine

## 2018-02-16 VITALS — BP 124/78 | HR 62 | Temp 98.2°F | Resp 12 | Ht 65.0 in | Wt 176.1 lb

## 2018-02-16 DIAGNOSIS — E785 Hyperlipidemia, unspecified: Secondary | ICD-10-CM

## 2018-02-16 DIAGNOSIS — Z23 Encounter for immunization: Secondary | ICD-10-CM

## 2018-02-16 DIAGNOSIS — Z13228 Encounter for screening for other metabolic disorders: Secondary | ICD-10-CM | POA: Diagnosis not present

## 2018-02-16 DIAGNOSIS — Z130 Encounter for screening for diseases of the blood and blood-forming organs and certain disorders involving the immune mechanism: Secondary | ICD-10-CM | POA: Diagnosis not present

## 2018-02-16 DIAGNOSIS — E559 Vitamin D deficiency, unspecified: Secondary | ICD-10-CM | POA: Diagnosis not present

## 2018-02-16 DIAGNOSIS — Z0000 Encounter for general adult medical examination without abnormal findings: Secondary | ICD-10-CM | POA: Diagnosis not present

## 2018-02-16 DIAGNOSIS — Z1329 Encounter for screening for other suspected endocrine disorder: Secondary | ICD-10-CM

## 2018-02-16 LAB — LIPID PANEL
CHOLESTEROL: 213 mg/dL — AB (ref 0–200)
HDL: 66.1 mg/dL (ref >39.00)
LDL Cholesterol: 125 mg/dL — ABNORMAL HIGH (ref 0–99)
NonHDL: 146.67
Total CHOL/HDL Ratio: 3
Triglycerides: 110 mg/dL (ref 0.0–149.0)
VLDL: 22 mg/dL (ref 0.0–40.0)

## 2018-02-16 LAB — COMPREHENSIVE METABOLIC PANEL
ALBUMIN: 4.5 g/dL (ref 3.5–5.2)
ALK PHOS: 65 U/L (ref 39–117)
ALT: 22 U/L (ref 0–35)
AST: 18 U/L (ref 0–37)
BILIRUBIN TOTAL: 0.5 mg/dL (ref 0.2–1.2)
BUN: 14 mg/dL (ref 6–23)
CALCIUM: 9.7 mg/dL (ref 8.4–10.5)
CO2: 28 mEq/L (ref 19–32)
Chloride: 105 mEq/L (ref 96–112)
Creatinine, Ser: 0.84 mg/dL (ref 0.40–1.20)
GFR: 72.76 mL/min (ref >60.00)
Glucose, Bld: 87 mg/dL (ref 70–99)
POTASSIUM: 4.8 meq/L (ref 3.5–5.1)
Sodium: 140 mEq/L (ref 135–145)
TOTAL PROTEIN: 6.8 g/dL (ref 6.0–8.3)

## 2018-02-16 LAB — VITAMIN D 25 HYDROXY (VIT D DEFICIENCY, FRACTURES): VITD: 34.52 ng/mL (ref 30.00–100.00)

## 2018-02-16 MED ORDER — ZOSTER VAC RECOMB ADJUVANTED 50 MCG/0.5ML IM SUSR: INTRAMUSCULAR | 1 refills | Status: AC

## 2018-02-16 NOTE — Patient Instructions (Signed)
A few things to remember from today's visit:   Routine general medical examination at a health care facility  Hyperlipidemia, mild - Plan: Lipid panel  Vitamin D deficiency, unspecified - Plan: VITAMIN D 25 Hydroxy (Vit-D Deficiency, Fractures)  Screening for endocrine, metabolic and immunity disorder - Plan: Comprehensive metabolic panel  Today you have you routine preventive visit.  At least 150 minutes of moderate exercise per week, daily brisk walking for 15-30 min is a good exercise option. Healthy diet low in saturated (animal) fats and sweets and consisting of fresh fruits and vegetables, lean meats such as fish and white chicken and whole grains.  These are some of recommendations for screening depending of age and risk factors:   - Vaccines:  Tdap vaccine every 10 years.  Shingles vaccine recommended at age 11, could be given after 63 years of age but not sure about insurance coverage.   Pneumonia vaccines:  Prevnar 13 at 65 and Pneumovax at 42. Sometimes Pneumovax is giving earlier if history of smoking, lung disease,diabetes,kidney disease among some.    Screening for diabetes at age 90 and every 3 years.  Cervical cancer prevention:  Pap smear starts at 63 years of age and continues periodically until 63 years old in low risk women. Pap smear every 3 years between 26 and 51 years old. Pap smear every 3-5 years between women 41 and older if pap smear negative and HPV screening negative.   -Breast cancer: Mammogram: There is disagreement between experts about when to start screening in low risk asymptomatic female but recent recommendations are to start screening at 24 and not later than 63 years old , every 1-2 years and after 63 yo q 2 years. Screening is recommended until 64 years old but some women can continue screening depending of healthy issues.   Colon cancer screening: starts at 63 years old until 63 years old.  Also recommended:  1. Dental visit- Brush  and floss your teeth twice daily; visit your dentist twice a year. 2. Eye doctor- Get an eye exam at least every 2 years. 3. Helmet use- Always wear a helmet when riding a bicycle, motorcycle, rollerblading or skateboarding. 4. Safe sex- If you may be exposed to sexually transmitted infections, use a condom. 5. Seat belts- Seat belts can save your live; always wear one. 6. Smoke/Carbon Monoxide detectors- These detectors need to be installed on the appropriate level of your home. Replace batteries at least once a year. 7. Skin cancer- When out in the sun please cover up and use sunscreen 15 SPF or higher. 8. Violence- If anyone is threatening or hurting you, please tell your healthcare provider.  9. Drink alcohol in moderation- Limit alcohol intake to one drink or less per day. Never drink and drive. Please be sure medication list is accurate. If a new problem present, please set up appointment sooner than planned today.

## 2018-02-16 NOTE — Progress Notes (Signed)
HPI:   [name] is a 62 y.o. female, who is here today for her routine physical.  Last CPE: 01/15/17.  Regular exercise 3 or more time per week: She does not exercise regularly but is active around the house/chores. Following a healthful diet: Yes She lives with her husband.  Chronic medical problems: Recurrent herpes infection, vit D deficiency, hand OA, and HLD among some.  Vit D deficiency: She is on Vit D 2000 U daily.  HLD on Atorvastatin 40 mg daily. She is also following low fat diet. Pap smear 09/2014. Next appt with her gyn in 02/2018.   Immunization History  Administered Date(s) Administered  . Influenza,inj,Quad PF,6+ Mos 01/24/2016, 01/15/2017, 02/16/2018  . Tdap 03/22/2014    Mammogram: 01/2017, Bi-Rads 1. Colonoscopy: 04/06/2012. DEXA: 10/2014, osteopenia. Gyn is following this problem.  Hep C screening: 12/2015   She fell on 02/10/18 , she was carrying some trays to her car,tripped,landed on her right side. She was able to get up with no help,has not had any limitation of ROM, joint edema or deformity.  Review of Systems  Constitutional: Negative for appetite change, fatigue and fever.  HENT: Negative for dental problem, hearing loss, mouth sores, sore throat, trouble swallowing and voice change.   Eyes: Negative for redness and visual disturbance.  Respiratory: Negative for cough, shortness of breath and wheezing.   Cardiovascular: Negative for chest pain and leg swelling.  Gastrointestinal: Negative for abdominal pain, nausea and vomiting.       No changes in bowel habits.  Endocrine: Negative for cold intolerance, heat intolerance, polydipsia, polyphagia and polyuria.  Genitourinary: Negative for decreased urine volume, dysuria, hematuria, vaginal bleeding and vaginal discharge.  Musculoskeletal: Negative for gait problem and myalgias. Arthralgias (chronic) Skin: Negative for color change and rash.  Allergic/Immunologic: Negative  for environmental allergies.  Neurological: Negative for syncope, weakness and headaches.  Hematological: Negative for adenopathy. Does not bruise/bleed easily.  Psychiatric/Behavioral: Negative for confusion and sleep disturbance. The patient is not nervous/anxious.   All other systems reviewed and are negative.     Current Outpatient Medications on File Prior to Visit  Medication Sig Dispense Refill  . Ascorbic Acid (VITAMIN C PO) Take by mouth daily. Reported on 09/07/2015    . atorvastatin (LIPITOR) 40 MG tablet Take 40 mg by mouth daily.    . Calcium Carbonate-Vitamin D (CALCIUM + D PO) Take by mouth.      . Cyanocobalamin (VITAMIN B 12 PO) Take by mouth daily. Reported on 09/07/2015    . desonide (DESOWEN) 0.05 % cream use as needed 60 g 2  . Diclofenac Sodium (PENNSAID) 2 % SOLN Place 2 g onto the skin 2 (two) times daily. 112 g 3  . Flaxseed, Linseed, (FLAX SEED OIL PO) Take by mouth daily.      . hydrocortisone-pramoxine (ANALPRAM HC) 2.5-1 % rectal cream Place 1 application rectally 3 (three) times daily. 30 g 3  . meloxicam (MOBIC) 15 MG tablet Take 15 mg by mouth daily.    . nitroGLYCERIN (NITRODUR - DOSED IN MG/24 HR) 0.2 mg/hr patch 1/4 patch daily 30 patch 1  . Omega-3 Fatty Acids (FISH OIL) 1200 MG CAPS Take by mouth.      . pseudoephedrine (SUDAFED 12 HOUR) 120 MG 12 hr tablet Take 1 tablet (120 mg total) by mouth 2 (two) times daily. 30 tablet 3  . valACYclovir (VALTREX) 500 MG tablet TAKE 1 TABLET BY MOUTH TWICE A DAY AS  NEEDED 90 tablet 4  . VITAMIN E PO Take by mouth daily.       No current facility-administered medications on file prior to visit.      Past Medical History:  Diagnosis Date  . Arthritis   . Heart murmur   . Hyperlipidemia   . Tubal ligation status     Past Surgical History:  Procedure Laterality Date  . APPENDECTOMY  1967  . HYSTEROSCOPY  10.10.2012   w/REMOVAL OF MYOMECTOMY  . KNEE SURGERY    . TUBAL LIGATION  1982    Allergies    Allergen Reactions  . Codeine   . Penicillins     Family History  Problem Relation Age of Onset  . Diabetes Mother        DM I  . Diabetes Brother   . Mental retardation Brother   . Colon cancer Maternal Grandmother   . Cancer Father        Leukemia  . Depression Paternal Grandmother   . Heart disease Brother        CAD/MI    Social History   Socioeconomic History  . Marital status: Married    Spouse name: Not on file  . Number of children: Not on file  . Years of education: Not on file  . Highest education level: Not on file  Occupational History  . Not on file  Social Needs  . Financial resource strain: Not on file  . Food insecurity:    Worry: Not on file    Inability: Not on file  . Transportation needs:    Medical: Not on file    Non-medical: Not on file  Tobacco Use  . Smoking status: Never Smoker  . Smokeless tobacco: Never Used  Substance and Sexual Activity  . Alcohol use: Yes    Alcohol/week: 0.0 standard drinks    Comment: occ  . Drug use: No  . Sexual activity: Yes    Partners: Male    Birth control/protection: Post-menopausal    Comment: INTEROCOURSE AGE 42, SEXUAL PARTNERS LESS THAN 5  Lifestyle  . Physical activity:    Days per week: Not on file    Minutes per session: Not on file  . Stress: Not on file  Relationships  . Social connections:    Talks on phone: Not on file    Gets together: Not on file    Attends religious service: Not on file    Active member of club or organization: Not on file    Attends meetings of clubs or organizations: Not on file    Relationship status: Not on file  Other Topics Concern  . Not on file  Social History Narrative  . Not on file     Vitals:   02/16/18 1125  BP: 124/78  Pulse: 62  Resp: 12  Temp: 98.2 F (36.8 C)  SpO2: 97%   Body mass index is 29.31 kg/m.   Wt Readings from Last 3 Encounters:  02/16/18 176 lb 2 oz (79.9 kg)  03/03/17 185 lb (83.9 kg)  02/14/17 183 lb (83 kg)      Physical Exam  Nursing note and vitals reviewed. Constitutional: She is oriented to person, place, and time. She appears well-developed and in no distress.  HENT:  Head: Normocephalic and atraumatic.  Right Ear: Hearing, tympanic membrane, external ear and ear canal normal.  Left Ear: Hearing, tympanic membrane, external ear and ear canal normal.  Mouth/Throat: Uvula is midline, oropharynx is clear and  moist and mucous membranes are normal.  Eyes: Pupils are equal, round, and reactive to light. Conjunctivae and EOM are normal.  Neck: No tracheal deviation present. No thyromegaly present. Small lymph node right post cervical 1 cm,no tender and mobile. Cardiovascular: Normal rate and regular rhythm.  SEM I/VI RUSB-LUSB Pulses:      Dorsalis pedis pulses are 2+ on the right side, and 2+ on the left side.  Respiratory: Effort normal and breath sounds normal. No respiratory distress.  GI: Soft. She exhibits no mass. There is no hepatomegaly. There is no tenderness.  Genitourinary:  Genitourinary Comments: Deferred to gyn.  Musculoskeletal: She exhibits no edema.  No major deformity or signs of synovitis appreciated.  Lymphadenopathy:    She has no cervical adenopathy.       Right: No supraclavicular adenopathy present.       Left: No supraclavicular adenopathy present.  Neurological: She is alert and oriented to person, place, and time. She has normal strength. No cranial nerve deficit. Coordination and gait normal.  Reflex Scores:      Bicep reflexes are 2+ on the right side and 2+ on the left side.      Patellar reflexes are 2+ on the right side and 2+ on the left side. Skin: Skin is warm. No rash noted. No erythema.  Psychiatric: She has a normal mood and affect.  Well groomed, good eye contact.     ASSESSMENT AND PLAN:  Ms. Julia Warren was here today annual physical examination.   Orders Placed This Encounter  Procedures  . Flu Vaccine QUAD 36+ mos IM  .  Comprehensive metabolic panel  . Lipid panel  . VITAMIN D 25 Hydroxy (Vit-D Deficiency, Fractures)    Lab Results  Component Value Date   ALT 22 02/16/2018   AST 18 02/16/2018   ALKPHOS 65 02/16/2018   BILITOT 0.5 02/16/2018   Lab Results  Component Value Date   CREATININE 0.84 02/16/2018   BUN 14 02/16/2018   NA 140 02/16/2018   K 4.8 02/16/2018   CL 105 02/16/2018   CO2 28 02/16/2018   Lab Results  Component Value Date   CHOL 213 (H) 02/16/2018   HDL 66.10 02/16/2018   LDLCALC 125 (H) 02/16/2018   LDLDIRECT 169.0 01/21/2017   TRIG 110.0 02/16/2018   CHOLHDL 3 02/16/2018     Routine general medical examination at a health care facility We discussed the importance of regular physical activity and healthy diet for prevention of chronic illness and/or complications. Preventive guidelines reviewed. Vaccination are not up to date,she needs shingles vaccine. Rx for Singrix given. She will continue following with gynecologist for her female preventive care. Ca++ and vit D supplementation to continue.  Next CPE in a year.  The 10-year ASCVD risk score Mikey Bussing DC Brooke Bonito., et al., 2013) is: 4%   Values used to calculate the score:     Age: 30 years     Sex: Female     Is Non-Hispanic African American: No     Diabetic: No     Tobacco smoker: No     Systolic Blood Pressure: 329 mmHg     Is BP treated: No     HDL Cholesterol: 66.1 mg/dL     Total Cholesterol: 213 mg/dL  Hyperlipidemia, mild No changes in Atorvastatin dose. F/U in 6-12 months.  -     Lipid panel  Vitamin D deficiency, unspecified No changes in current management, will follow labs done today and will  give further recommendations accordingly.  -     VITAMIN D 25 Hydroxy (Vit-D Deficiency, Fractures)  Screening for endocrine, metabolic and immunity disorder -     Comprehensive metabolic panel  Need for immunization against influenza -     Flu Vaccine QUAD 36+ mos IM  Other orders -     Zoster Vaccine  Adjuvanted [facility]) injection; 0.5 ml in muscle and repeat in 8 weeks   We discussed finding on examination today, right 1 cm lymph node.She will continue monitoring for changes in size or symptoms.   Return in 1 year (on 02/17/2019) for cpe.      Diontae Route G. [place], MD  [facility]. [place] office.

## 2018-02-22 ENCOUNTER — Encounter: Payer: Self-pay | Admitting: Family Medicine

## 2018-02-22 MED ORDER — ATORVASTATIN CALCIUM 40 MG PO TABS: 40.0000 mg | ORAL_TABLET | Freq: Every day | ORAL | 3 refills | Status: DC

## 2018-02-23 ENCOUNTER — Other Ambulatory Visit: Payer: Self-pay | Admitting: *Deleted

## 2018-02-23 DIAGNOSIS — E785 Hyperlipidemia, unspecified: Secondary | ICD-10-CM

## 2018-02-23 MED ORDER — ATORVASTATIN CALCIUM 40 MG PO TABS: 40.0000 mg | ORAL_TABLET | Freq: Every day | ORAL | 3 refills | Status: AC

## 2018-06-28 IMAGING — DX DG CHEST 2V
2 series · 2 of 2 positions shown · non-contrast
Comparison: None.

CLINICAL DATA: Cough x 9 mos intermittent, non smoker.

EXAM:
CHEST  2 VIEW

[chest pa]
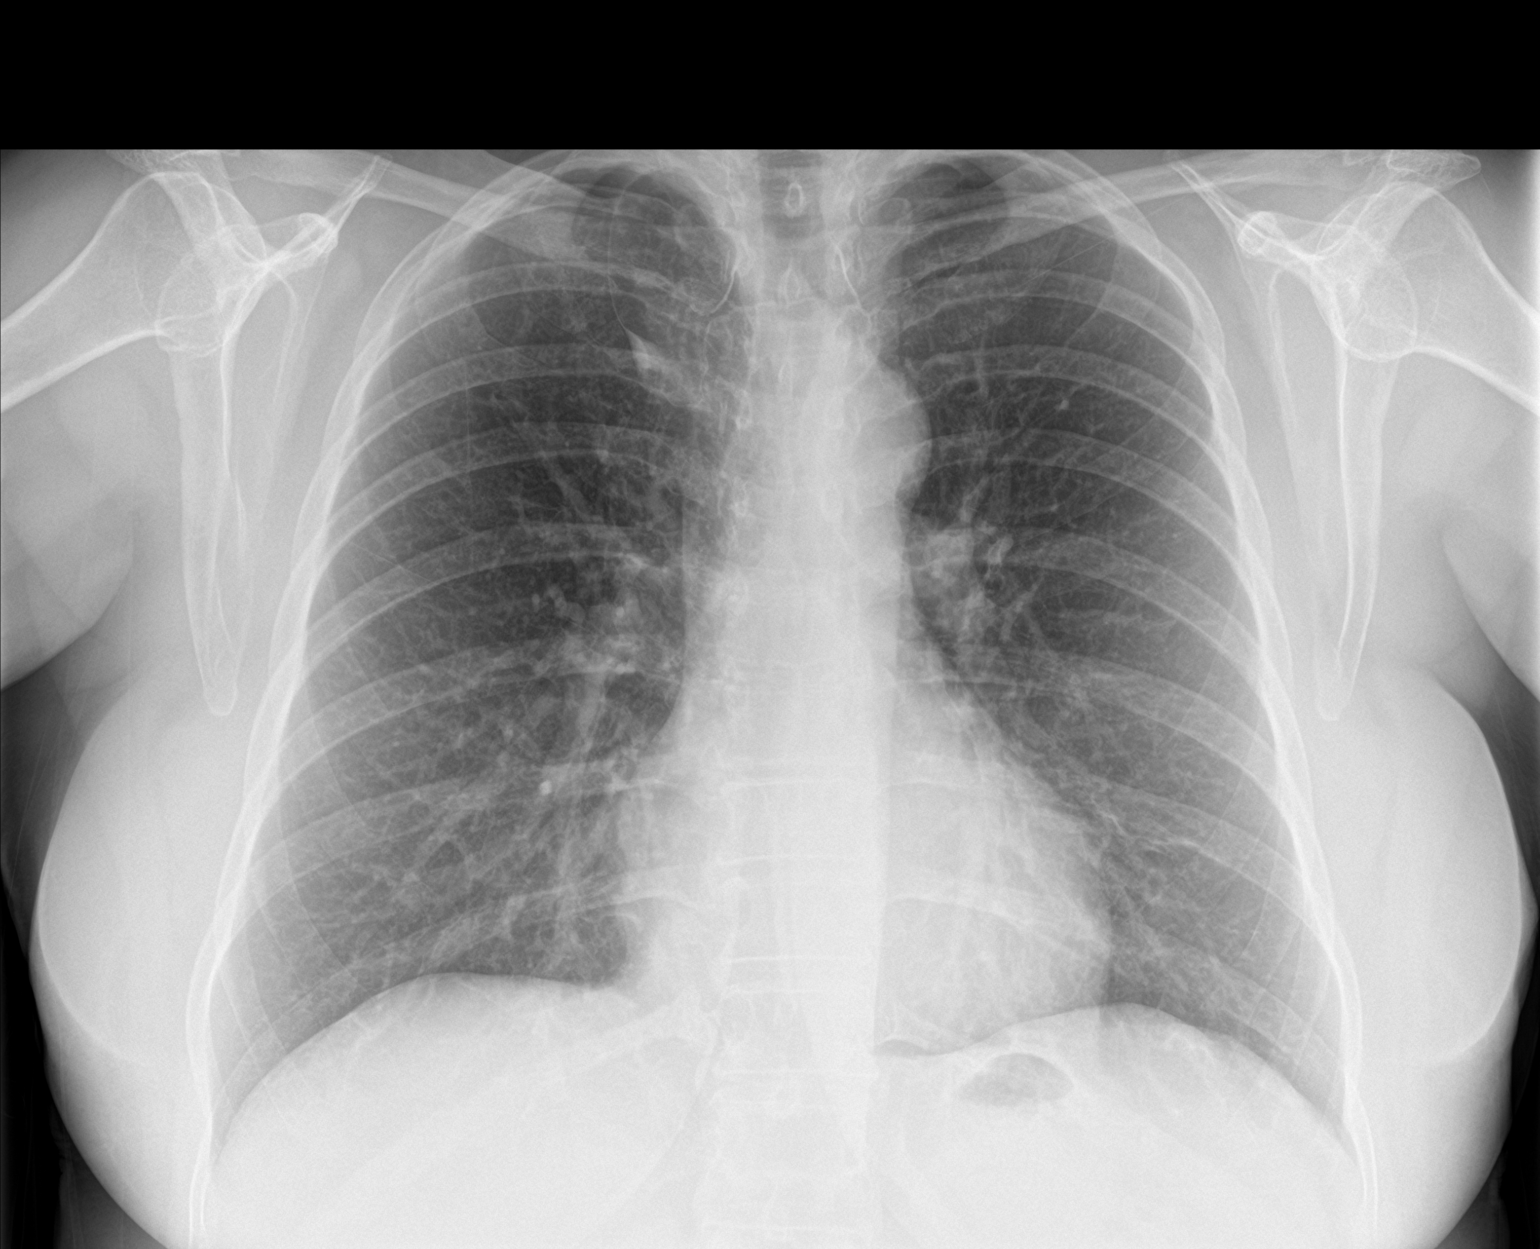

[chest lat]
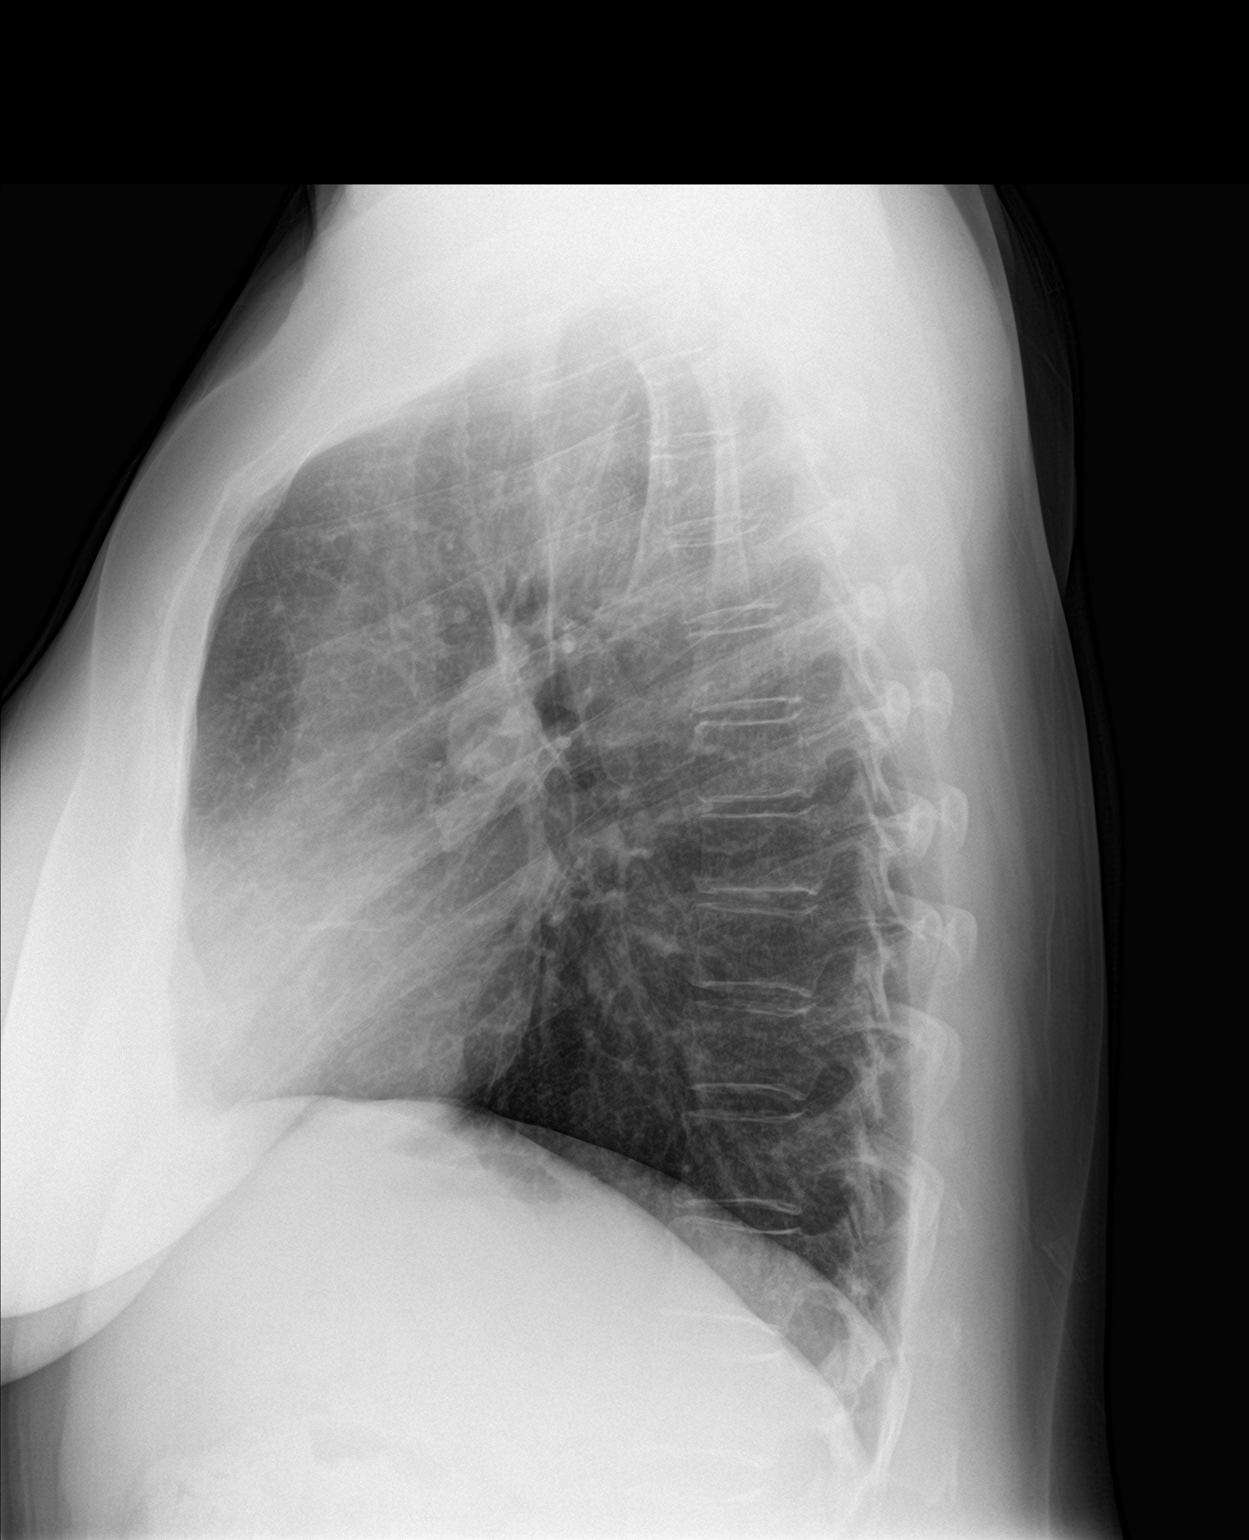

[2 of 2 positions shown; findings below may reference images not displayed]

FINDINGS: The heart size and mediastinal contours are within normal limits.
Both lungs are clear. The visualized skeletal structures are
unremarkable.
IMPRESSION: No active cardiopulmonary disease.

## 2019-02-23 DIAGNOSIS — Z20828 Contact with and (suspected) exposure to other viral communicable diseases: Secondary | ICD-10-CM | POA: Diagnosis not present

## 2019-04-22 ENCOUNTER — Ambulatory Visit: Payer: BLUE CROSS/BLUE SHIELD

## 2019-05-31 ENCOUNTER — Other Ambulatory Visit: Payer: Self-pay

## 2019-06-01 ENCOUNTER — Encounter: Payer: Self-pay | Admitting: Family Medicine

## 2019-06-01 ENCOUNTER — Ambulatory Visit (INDEPENDENT_AMBULATORY_CARE_PROVIDER_SITE_OTHER): Payer: BC Managed Care – PPO | Admitting: Family Medicine

## 2019-06-01 VITALS — BP 110/74 | HR 60 | Temp 98.0°F | Resp 12 | Ht 63.39 in | Wt 191.5 lb

## 2019-06-01 DIAGNOSIS — E785 Hyperlipidemia, unspecified: Secondary | ICD-10-CM

## 2019-06-01 DIAGNOSIS — R05 Cough: Secondary | ICD-10-CM

## 2019-06-01 DIAGNOSIS — E559 Vitamin D deficiency, unspecified: Secondary | ICD-10-CM | POA: Diagnosis not present

## 2019-06-01 DIAGNOSIS — K219 Gastro-esophageal reflux disease without esophagitis: Secondary | ICD-10-CM | POA: Diagnosis not present

## 2019-06-01 DIAGNOSIS — Z0000 Encounter for general adult medical examination without abnormal findings: Secondary | ICD-10-CM | POA: Diagnosis not present

## 2019-06-01 DIAGNOSIS — R053 Chronic cough: Secondary | ICD-10-CM

## 2019-06-01 LAB — COMPREHENSIVE METABOLIC PANEL
ALT: 27 U/L (ref 0–35)
AST: 21 U/L (ref 0–37)
Albumin: 4.5 g/dL (ref 3.5–5.2)
Alkaline Phosphatase: 71 U/L (ref 39–117)
BUN: 15 mg/dL (ref 6–23)
CO2: 29 mEq/L (ref 19–32)
Calcium: 10.3 mg/dL (ref 8.4–10.5)
Chloride: 103 mEq/L (ref 96–112)
Creatinine, Ser: 0.89 mg/dL (ref 0.40–1.20)
GFR: 63.77 mL/min (ref >60.00)
Glucose, Bld: 92 mg/dL (ref 70–99)
Potassium: 4.3 mEq/L (ref 3.5–5.1)
Sodium: 140 mEq/L (ref 135–145)
Total Bilirubin: 0.5 mg/dL (ref 0.2–1.2)
Total Protein: 6.8 g/dL (ref 6.0–8.3)

## 2019-06-01 LAB — LIPID PANEL
Cholesterol: 259 mg/dL — ABNORMAL HIGH (ref 0–200)
HDL: 55.4 mg/dL (ref >39.00)
NonHDL: 204.02
Total CHOL/HDL Ratio: 5
Triglycerides: 251 mg/dL — ABNORMAL HIGH (ref 0.0–149.0)
VLDL: 50.2 mg/dL — ABNORMAL HIGH (ref 0.0–40.0)

## 2019-06-01 LAB — VITAMIN D 25 HYDROXY (VIT D DEFICIENCY, FRACTURES): VITD: 23.9 ng/mL — ABNORMAL LOW (ref 30.00–100.00)

## 2019-06-01 LAB — LDL CHOLESTEROL, DIRECT: Direct LDL: 155 mg/dL

## 2019-06-01 MED ORDER — OMEPRAZOLE 40 MG PO CPDR: 40.0000 mg | DELAYED_RELEASE_CAPSULE | Freq: Every day | ORAL | 2 refills | Status: DC

## 2019-06-01 NOTE — Progress Notes (Signed)
HPI:   [name] is a 64 y.o. female, who is here today for her routine physical.  Last CPE: 02/16/2018. No new problems since her last visit.  Regular exercise 3 or more time per week: She has not been consistent for the past year, in part due to COVID-19 restrictions. Following a healthy diet: Not consistently, she has gained some weight. She lives with her husband, who has been at home most of the time due to COVID-19 pandemia.  Chronic medical problems: Hyperlipidemia,HSV 2 with rare outbreaks.  Pap smear: 2016. She follows with gyn. She follows with her gynecology regularly.  Immunization History  Administered Date(s) Administered  . Influenza,inj,Quad PF,6+ Mos 01/24/2016, 01/15/2017, 02/16/2018, 12/17/2018  . Tdap 03/22/2014   Mammogram: 01/2017. She got a reminder letter and can schedule. Colonoscopy: 03/2012 DEXA: 02/2017, ordered by gyn.  Hep C screening: 12/2015 NR.  Follow up and concerns today:   Vit D deficiency: She is on Vit D supplementation, ? 2000 U   Non productive cough stable. She has had cough for about 2 to 3 years. No associated dyspnea, wheezing, CP, or palpitations. Usually happens when she is at rest and at night. She has no symptoms with exertion.  Vitamin D deficiency: Currently she is on vitamin D 3 2000 units daily.  Hyperlipidemia: Currently she is on atorvastatin 40 mg daily. She is taking medication in the morning, she has noticed some heart burn after taking medication.  Component     Latest Ref Rng & Units 02/16/2018  Cholesterol     0 - 200 mg/dL 213 (H)  Triglycerides     0.0 - 149.0 mg/dL 110.0  HDL Cholesterol     >39.00 mg/dL 66.10  VLDL     0.0 - 40.0 mg/dL 22.0  Total CHOL/HDL Ratio      3  NonHDL      146.67   Review of Systems  Constitutional: Negative for appetite change, fatigue and fever.  HENT: Negative for hearing loss, mouth sores, sore throat, trouble swallowing and voice change.     Eyes: Negative for photophobia and visual disturbance.  Respiratory: Negative for chest tightness and stridor.   Cardiovascular: Negative for leg swelling.  Gastrointestinal: Negative for abdominal pain, nausea and vomiting.       No changes in bowel habits.  Endocrine: Negative for cold intolerance and heat intolerance.  Genitourinary: Negative for decreased urine volume, dysuria, hematuria, vaginal bleeding and vaginal discharge.  Musculoskeletal: Negative for arthralgias, back pain and neck pain.  Skin: Negative for color change and rash.  Neurological: Negative for syncope, weakness and headaches.  Hematological: Negative for adenopathy. Does not bruise/bleed easily.  Psychiatric/Behavioral: Negative for confusion and sleep disturbance. The patient is not nervous/anxious.   All other systems reviewed and are negative.  Current Outpatient Medications on File Prior to Visit  Medication Sig Dispense Refill  . Ascorbic Acid (VITAMIN C PO) Take by mouth daily. Reported on 09/07/2015    . atorvastatin (LIPITOR) 40 MG tablet Take 1 tablet (40 mg total) by mouth daily. 90 tablet 3  . Calcium Carbonate-Vitamin D (CALCIUM + D PO) Take by mouth.      . Cyanocobalamin (VITAMIN B 12 PO) Take by mouth daily. Reported on 09/07/2015    . desonide (DESOWEN) 0.05 % cream use as needed 60 g 2  . Diclofenac Sodium (PENNSAID) 2 % SOLN Place 2 g onto the skin 2 (two) times daily. 112 g 3  .  Flaxseed, Linseed, (FLAX SEED OIL PO) Take by mouth daily.      . hydrocortisone-pramoxine (ANALPRAM HC) 2.5-1 % rectal cream Place 1 application rectally 3 (three) times daily. 30 g 3  . Omega-3 Fatty Acids (FISH OIL) 1200 MG CAPS Take by mouth.      . pseudoephedrine (SUDAFED 12 HOUR) 120 MG 12 hr tablet Take 1 tablet (120 mg total) by mouth 2 (two) times daily. 30 tablet 3  . valACYclovir (VALTREX) 500 MG tablet TAKE 1 TABLET BY MOUTH TWICE A DAY AS NEEDED 90 tablet 4  . VITAMIN E PO Take by mouth daily.      Marland Kitchen  Zoster Vaccine Adjuvanted [facility]) injection 0.5 ml in muscle and repeat in 8 weeks 0.5 mL 1   No current facility-administered medications on file prior to visit.   Past Medical History:  Diagnosis Date  . Arthritis   . Heart murmur   . Hyperlipidemia   . Tubal ligation status     Past Surgical History:  Procedure Laterality Date  . APPENDECTOMY  1967  . HYSTEROSCOPY  10.10.2012   w/REMOVAL OF MYOMECTOMY  . KNEE SURGERY    . TUBAL LIGATION  1982    Allergies  Allergen Reactions  . Codeine   . Penicillins     Family History  Problem Relation Age of Onset  . Diabetes Mother        DM I  . Diabetes Brother   . Mental retardation Brother   . Colon cancer Maternal Grandmother   . Cancer Father        Leukemia  . Depression Paternal Grandmother   . Heart disease Brother        CAD/MI    Social History   Socioeconomic History  . Marital status: Married    Spouse name: Not on file  . Number of children: Not on file  . Years of education: Not on file  . Highest education level: Not on file  Occupational History  . Not on file  Tobacco Use  . Smoking status: Never Smoker  . Smokeless tobacco: Never Used  Substance and Sexual Activity  . Alcohol use: Yes    Alcohol/week: 0.0 standard drinks    Comment: occ  . Drug use: No  . Sexual activity: Yes    Partners: Male    Birth control/protection: Post-menopausal    Comment: INTEROCOURSE AGE 45, SEXUAL PARTNERS LESS THAN 5  Other Topics Concern  . Not on file  Social History Narrative  . Not on file   Social Determinants of Health   Financial Resource Strain:   . Difficulty of Paying Living Expenses:   Food Insecurity:   . Worried About Charity fundraiser in the Last Year:   . Arboriculturist in the Last Year:   Transportation Needs:   . Film/video editor (Medical):   Marland Kitchen Lack of Transportation (Non-Medical):   Physical Activity:   . Days of Exercise per Week:   . Minutes of Exercise per Session:    Stress:   . Feeling of Stress :   Social Connections:   . Frequency of Communication with Friends and Family:   . Frequency of Social Gatherings with Friends and Family:   . Attends Religious Services:   . Active Member of Clubs or Organizations:   . Attends Archivist Meetings:   Marland Kitchen Marital Status:    Vitals:   06/01/19 0740  BP: 110/74  Pulse: 60  Resp:  12  Temp: 98 F (36.7 C)  SpO2: 96%   Body mass index is 33.51 kg/m.  Wt Readings from Last 3 Encounters:  06/01/19 191 lb 8 oz (86.9 kg)  02/16/18 176 lb 2 oz (79.9 kg)  03/03/17 185 lb (83.9 kg)   Physical Exam  Nursing note and vitals reviewed. Constitutional: She is oriented to person, place, and time. She appears well-developed. No distress.  HENT:  Head: Normocephalic and atraumatic.  Right Ear: Hearing, tympanic membrane, external ear and ear canal normal.  Left Ear: Hearing, tympanic membrane, external ear and ear canal normal.  Mouth/Throat: Uvula is midline, oropharynx is clear and moist and mucous membranes are normal.  Eyes: Pupils are equal, round, and reactive to light. Conjunctivae and EOM are normal.  Neck: No tracheal deviation present. No thyromegaly present.  Cardiovascular: Normal rate and regular rhythm.  No murmur heard. Pulses:      Dorsalis pedis pulses are 2+ on the right side and 2+ on the left side.  Respiratory: Effort normal and breath sounds normal. No respiratory distress.  GI: Soft. She exhibits no mass. There is no hepatomegaly. There is no abdominal tenderness.  Genitourinary:    Genitourinary Comments: Deferred to gyn.   Musculoskeletal:        General: No edema.     Comments: No major deformity or signs of synovitis appreciated.  Lymphadenopathy:    She has no cervical adenopathy.       Right: No supraclavicular adenopathy present.       Left: No supraclavicular adenopathy present.  Neurological: She is alert and oriented to person, place, and time. She has normal  strength. No cranial nerve deficit. Coordination and gait normal.  Reflex Scores:      Bicep reflexes are 2+ on the right side and 2+ on the left side.      Patellar reflexes are 2+ on the right side and 2+ on the left side. Skin: Skin is warm. No rash noted. No erythema.  Psychiatric: She has a normal mood and affect. Cognition and memory are normal.  Well groomed, good eye contact.   ASSESSMENT AND PLAN:  Ms. Soyna Frier was here today annual physical examination.  Orders Placed This Encounter  Procedures  . VITAMIN D 25 Hydroxy (Vit-D Deficiency, Fractures)  . Comprehensive metabolic panel  . Lipid panel  . LDL cholesterol, direct    Lab Results  Component Value Date   CHOL 259 (H) 06/01/2019   HDL 55.40 06/01/2019   LDLCALC 125 (H) 02/16/2018   LDLDIRECT 155.0 06/01/2019   TRIG 251.0 (H) 06/01/2019   CHOLHDL 5 06/01/2019   Lab Results  Component Value Date   ALT 27 06/01/2019   AST 21 06/01/2019   ALKPHOS 71 06/01/2019   BILITOT 0.5 06/01/2019   Lab Results  Component Value Date   CREATININE 0.89 06/01/2019   BUN 15 06/01/2019   NA 140 06/01/2019   K 4.3 06/01/2019   CL 103 06/01/2019   CO2 29 06/01/2019    Routine general medical examination at a health care facility We discussed the importance of regular physical activity and healthy diet for prevention of chronic illness and/or complications. Preventive guidelines reviewed. Vaccination: Pneumovax and shingrix in 12/2019. Getting COVID 19 vaccine.  Ca++ and vit D supplementation recommended. Next CPE in a year.  The 10-year ASCVD risk score Mikey Bussing DC Jr., et al., 2013) is: 4.3%   Values used to calculate the score:     Age: 41  years     Sex: Female     Is Non-Hispanic African American: No     Diabetic: No     Tobacco smoker: No     Systolic Blood Pressure: [id number] mmHg     Is BP treated: No     HDL Cholesterol: 55.4 mg/dL     Total Cholesterol: 259 mg/dL  Vitamin D deficiency,  unspecified Continue current dose of vit D supplementation. Further recommendations will be given according to 25 [place] vit D results.  Hyperlipidemia, mild Continue Atorvastatin 40 mg daily.  Will follow FLP done today and will give further recommendations accordingly.  Chronic cough We discussed possible etiologies. ? GERD,allergies among some. Chronic problem, CXR was done in 12//2018 when she was having same symptoms. PPI trial recommended. Instructed to let me know if she is not any better in which case pulmonology referral may be considered.  Gastroesophageal reflux disease, unspecified whether esophagitis present Omeprazole 40 mg daily x 6-8 weeks then it can be decreased to 20 mg daily and prn. GERD precautions.  -     omeprazole (PRILOSEC) 40 MG capsule; Take 1 capsule (40 mg total) by mouth daily before breakfast.   Return in 1 year (on 05/31/2020).   Baleigh Rennaker G. [place], MD  [facility]. [place] [facility].   Omeprazole before breakfast, if not better in 4-6 weeks we can consider pulmonologist evaluation. Hold on vaccine because COVID 19 vaccine.  Please be sure medication list is accurate. If a new problem present, please set up appointment sooner than planned today.  At least 150 minutes of moderate exercise per week, daily brisk walking for 15-30 min is a good exercise option. Healthy diet low in saturated (animal) fats and sweets and consisting of fresh fruits and vegetables, lean meats such as fish and white chicken and whole grains.  These are some of recommendations for screening depending of age and risk factors:  - Vaccines:  Tdap vaccine every 10 years.  Shingles vaccine recommended at age 63, could be given after 65 years of age but not sure about insurance coverage.   Pneumonia vaccines: Pneumovax at 72. Sometimes Pneumovax is giving earlier if history of smoking, lung disease,diabetes,kidney disease among some.  Screening for diabetes at age 20  and every 3 years.  Cervical cancer prevention:  Pap smear starts at 65 years of age and continues periodically until 65 years old in low risk women. Pap smear every 3 years between 17 and 87 years old. Pap smear every 3-5 years between women 107 and older if pap smear negative and HPV screening negative.   -Breast cancer: Mammogram: There is disagreement between experts about when to start screening in low risk asymptomatic female but recent recommendations are to start screening at 32 and not later than 65 years old , every 1-2 years and after 65 yo q 2 years. Screening is recommended until 65 years old but some women can continue screening depending of healthy issues.  Colon cancer screening: starts at 65 years old until 65 years old.  Cholesterol disorder screening at age 20 and every 3 years.N/A  Also recommended:  1. Dental visit- Brush and floss your teeth twice daily; visit your dentist twice a year. 2. Eye doctor- Get an eye exam at least every 2 years. 3. Helmet use- Always wear a helmet when riding a bicycle, motorcycle, rollerblading or skateboarding. 4. Safe sex- If you may be exposed to sexually transmitted infections, use a condom. 5. Seat belts- Seat belts  can save your live; always wear one. 6. Smoke/Carbon Monoxide detectors- These detectors need to be installed on the appropriate level of your home. Replace batteries at least once a year. 7. Skin cancer- When out in the sun please cover up and use sunscreen 15 SPF or higher. 8. Violence- If anyone is threatening or hurting you, please tell your healthcare provider.  9. Drink alcohol in moderation- Limit alcohol intake to one drink or less per day. Never drink and drive.

## 2019-06-01 NOTE — Patient Instructions (Addendum)
Today you have you routine preventive visit. A few things to remember from today's visit:   Routine general medical examination at a health care facility  Vitamin D deficiency, unspecified - Plan: VITAMIN D 25 Hydroxy (Vit-D Deficiency, Fractures)  Hyperlipidemia, mild - Plan: Comprehensive metabolic panel, Lipid panel  Screening for endocrine, metabolic and immunity disorder - Plan: Comprehensive metabolic panel  Omeprazole before breakfast, if not better in 4-6 weeks we can consider pulmonologist evaluation. Hold on vaccine because CI=OVID 19 vaccine.  Please be sure medication list is accurate. If a new problem present, please set up appointment sooner than planned today.        At least 150 minutes of moderate exercise per week, daily brisk walking for 15-30 min is a good exercise option. Healthy diet low in saturated (animal) fats and sweets and consisting of fresh fruits and vegetables, lean meats such as fish and white chicken and whole grains.  These are some of recommendations for screening depending of age and risk factors:  - Vaccines:  Tdap vaccine every 10 years.  Shingles vaccine recommended at age 70, could be given after 65 years of age but not sure about insurance coverage.   Pneumonia vaccines: Pneumovax at 52. Sometimes Pneumovax is giving earlier if history of smoking, lung disease,diabetes,kidney disease among some.  Screening for diabetes at age 46 and every 3 years.  Cervical cancer prevention:  Pap smear starts at 65 years of age and continues periodically until 65 years old in low risk women. Pap smear every 3 years between 57 and 6 years old. Pap smear every 3-5 years between women 29 and older if pap smear negative and HPV screening negative.   -Breast cancer: Mammogram: There is disagreement between experts about when to start screening in low risk asymptomatic female but recent recommendations are to start screening at 32 and not later than  65 years old , every 1-2 years and after 65 yo q 2 years. Screening is recommended until 65 years old but some women can continue screening depending of healthy issues.  Colon cancer screening: starts at 65 years old until 65 years old.  Cholesterol disorder screening at age 39 and every 3 years.N/A  Also recommended:  1. Dental visit- Brush and floss your teeth twice daily; visit your dentist twice a year. 2. Eye doctor- Get an eye exam at least every 2 years. 3. Helmet use- Always wear a helmet when riding a bicycle, motorcycle, rollerblading or skateboarding. 4. Safe sex- If you may be exposed to sexually transmitted infections, use a condom. 5. Seat belts- Seat belts can save your live; always wear one. 6. Smoke/Carbon Monoxide detectors- These detectors need to be installed on the appropriate level of your home. Replace batteries at least once a year. 7. Skin cancer- When out in the sun please cover up and use sunscreen 15 SPF or higher. 8. Violence- If anyone is threatening or hurting you, please tell your healthcare provider.  9. Drink alcohol in moderation- Limit alcohol intake to one drink or less per day. Never drink and drive.

## 2019-06-03 ENCOUNTER — Encounter: Payer: Self-pay | Admitting: Family Medicine

## 2019-06-14 ENCOUNTER — Other Ambulatory Visit: Payer: Self-pay | Admitting: *Deleted

## 2019-06-14 DIAGNOSIS — K219 Gastro-esophageal reflux disease without esophagitis: Secondary | ICD-10-CM

## 2019-06-14 MED ORDER — ESOMEPRAZOLE MAGNESIUM 40 MG PO CPDR: 40.0000 mg | DELAYED_RELEASE_CAPSULE | Freq: Every day | ORAL | 3 refills | Status: DC

## 2019-06-15 ENCOUNTER — Other Ambulatory Visit: Payer: Self-pay | Admitting: *Deleted

## 2019-06-15 DIAGNOSIS — K219 Gastro-esophageal reflux disease without esophagitis: Secondary | ICD-10-CM

## 2019-06-15 MED ORDER — OMEPRAZOLE 40 MG PO CPDR: 40.0000 mg | DELAYED_RELEASE_CAPSULE | Freq: Every day | ORAL | 2 refills | Status: DC

## 2019-06-18 ENCOUNTER — Other Ambulatory Visit: Payer: Self-pay | Admitting: *Deleted

## 2019-06-18 MED ORDER — ESOMEPRAZOLE MAGNESIUM 40 MG PO CPDR: 40.0000 mg | DELAYED_RELEASE_CAPSULE | Freq: Every day | ORAL | 0 refills | Status: AC

## 2019-06-30 ENCOUNTER — Other Ambulatory Visit: Payer: Self-pay | Admitting: Family Medicine

## 2019-06-30 DIAGNOSIS — Z1231 Encounter for screening mammogram for malignant neoplasm of breast: Secondary | ICD-10-CM

## 2019-08-09 ENCOUNTER — Ambulatory Visit
Admission: RE | Admit: 2019-08-09 | Discharge: 2019-08-09 | Disposition: A | Discharge status: Home / Self Care | Payer: BC Managed Care – PPO | Source: Ambulatory Visit | Attending: Family Medicine | Admitting: Family Medicine

## 2019-08-09 ENCOUNTER — Other Ambulatory Visit: Payer: Self-pay

## 2019-08-09 DIAGNOSIS — Z1231 Encounter for screening mammogram for malignant neoplasm of breast: Secondary | ICD-10-CM | POA: Diagnosis not present

## 2020-05-19 DIAGNOSIS — M7732 Calcaneal spur, left foot: Secondary | ICD-10-CM | POA: Diagnosis not present

## 2020-05-19 DIAGNOSIS — M7662 Achilles tendinitis, left leg: Secondary | ICD-10-CM | POA: Diagnosis not present

## 2020-05-31 DIAGNOSIS — M7662 Achilles tendinitis, left leg: Secondary | ICD-10-CM | POA: Diagnosis not present

## 2020-06-15 DIAGNOSIS — M7662 Achilles tendinitis, left leg: Secondary | ICD-10-CM | POA: Diagnosis not present

## 2021-12-02 DIAGNOSIS — R051 Acute cough: Secondary | ICD-10-CM | POA: Diagnosis not present
